# Patient Record
Sex: Male | Born: 1980 | Race: Asian | Hispanic: No | Marital: Single | Smoking: Former smoker
Health system: Southern US, Community
[De-identification: ages and names within clinical notes are randomized; demographics above are authoritative.]

## PROBLEM LIST (undated history)

## (undated) DIAGNOSIS — G510 Bell's palsy: Secondary | ICD-10-CM

## (undated) DIAGNOSIS — R519 Headache, unspecified: Secondary | ICD-10-CM

## (undated) DIAGNOSIS — E7800 Pure hypercholesterolemia, unspecified: Secondary | ICD-10-CM

## (undated) DIAGNOSIS — F32A Depression, unspecified: Secondary | ICD-10-CM

## (undated) DIAGNOSIS — M1990 Unspecified osteoarthritis, unspecified site: Secondary | ICD-10-CM

---

## 2019-01-08 DIAGNOSIS — F321 Major depressive disorder, single episode, moderate: Secondary | ICD-10-CM | POA: Diagnosis not present

## 2019-01-08 DIAGNOSIS — F419 Anxiety disorder, unspecified: Secondary | ICD-10-CM | POA: Diagnosis not present

## 2019-02-12 DIAGNOSIS — F419 Anxiety disorder, unspecified: Secondary | ICD-10-CM | POA: Diagnosis not present

## 2019-02-12 DIAGNOSIS — F321 Major depressive disorder, single episode, moderate: Secondary | ICD-10-CM | POA: Diagnosis not present

## 2019-04-04 DIAGNOSIS — F321 Major depressive disorder, single episode, moderate: Secondary | ICD-10-CM | POA: Diagnosis not present

## 2019-04-04 DIAGNOSIS — F419 Anxiety disorder, unspecified: Secondary | ICD-10-CM | POA: Diagnosis not present

## 2019-05-14 DIAGNOSIS — F419 Anxiety disorder, unspecified: Secondary | ICD-10-CM | POA: Diagnosis not present

## 2019-05-14 DIAGNOSIS — F321 Major depressive disorder, single episode, moderate: Secondary | ICD-10-CM | POA: Diagnosis not present

## 2019-06-18 DIAGNOSIS — Z20828 Contact with and (suspected) exposure to other viral communicable diseases: Secondary | ICD-10-CM | POA: Diagnosis not present

## 2019-09-04 DIAGNOSIS — F321 Major depressive disorder, single episode, moderate: Secondary | ICD-10-CM | POA: Diagnosis not present

## 2019-09-04 DIAGNOSIS — F419 Anxiety disorder, unspecified: Secondary | ICD-10-CM | POA: Diagnosis not present

## 2019-11-26 DIAGNOSIS — F321 Major depressive disorder, single episode, moderate: Secondary | ICD-10-CM | POA: Diagnosis not present

## 2019-11-26 DIAGNOSIS — F419 Anxiety disorder, unspecified: Secondary | ICD-10-CM | POA: Diagnosis not present

## 2020-05-09 DIAGNOSIS — H9193 Unspecified hearing loss, bilateral: Secondary | ICD-10-CM | POA: Diagnosis not present

## 2020-07-15 ENCOUNTER — Other Ambulatory Visit: Payer: Self-pay

## 2020-07-15 ENCOUNTER — Observation Stay (HOSPITAL_COMMUNITY): Payer: 59

## 2020-07-15 ENCOUNTER — Encounter (HOSPITAL_COMMUNITY): Payer: Self-pay | Admitting: *Deleted

## 2020-07-15 ENCOUNTER — Emergency Department (HOSPITAL_COMMUNITY): Payer: 59

## 2020-07-15 ENCOUNTER — Inpatient Hospital Stay (HOSPITAL_COMMUNITY)
Admission: EM | Admit: 2020-07-15 | Discharge: 2020-07-17 | DRG: 065 | Disposition: A | Discharge status: Home / Self Care | Payer: 59 | Attending: Family Medicine | Admitting: Family Medicine

## 2020-07-15 DIAGNOSIS — Z20822 Contact with and (suspected) exposure to covid-19: Secondary | ICD-10-CM | POA: Diagnosis present

## 2020-07-15 DIAGNOSIS — R4781 Slurred speech: Secondary | ICD-10-CM | POA: Diagnosis present

## 2020-07-15 DIAGNOSIS — E781 Pure hyperglyceridemia: Secondary | ICD-10-CM | POA: Diagnosis present

## 2020-07-15 DIAGNOSIS — R29701 NIHSS score 1: Secondary | ICD-10-CM | POA: Diagnosis present

## 2020-07-15 DIAGNOSIS — R200 Anesthesia of skin: Secondary | ICD-10-CM | POA: Diagnosis not present

## 2020-07-15 DIAGNOSIS — G8191 Hemiplegia, unspecified affecting right dominant side: Secondary | ICD-10-CM | POA: Diagnosis present

## 2020-07-15 DIAGNOSIS — E785 Hyperlipidemia, unspecified: Secondary | ICD-10-CM | POA: Diagnosis present

## 2020-07-15 DIAGNOSIS — I639 Cerebral infarction, unspecified: Secondary | ICD-10-CM | POA: Diagnosis not present

## 2020-07-15 DIAGNOSIS — I6522 Occlusion and stenosis of left carotid artery: Secondary | ICD-10-CM | POA: Diagnosis not present

## 2020-07-15 DIAGNOSIS — F17200 Nicotine dependence, unspecified, uncomplicated: Secondary | ICD-10-CM | POA: Diagnosis present

## 2020-07-15 DIAGNOSIS — I779 Disorder of arteries and arterioles, unspecified: Secondary | ICD-10-CM

## 2020-07-15 DIAGNOSIS — R7401 Elevation of levels of liver transaminase levels: Secondary | ICD-10-CM

## 2020-07-15 DIAGNOSIS — I63132 Cerebral infarction due to embolism of left carotid artery: Principal | ICD-10-CM | POA: Diagnosis present

## 2020-07-15 DIAGNOSIS — R748 Abnormal levels of other serum enzymes: Secondary | ICD-10-CM | POA: Diagnosis present

## 2020-07-15 DIAGNOSIS — D696 Thrombocytopenia, unspecified: Secondary | ICD-10-CM | POA: Diagnosis present

## 2020-07-15 HISTORY — DX: Bell's palsy: G51.0

## 2020-07-15 HISTORY — DX: Cerebral infarction, unspecified: I63.9

## 2020-07-15 HISTORY — DX: Pure hypercholesterolemia, unspecified: E78.00

## 2020-07-15 LAB — DIFFERENTIAL
Abs Immature Granulocytes: 0.03 10*3/uL (ref 0.00–0.07)
Basophils Absolute: 0.1 10*3/uL (ref 0.0–0.1)
Basophils Relative: 1 %
Eosinophils Absolute: 0.2 10*3/uL (ref 0.0–0.5)
Eosinophils Relative: 3 %
Immature Granulocytes: 0 %
Lymphocytes Relative: 28 %
Lymphs Abs: 2.1 10*3/uL (ref 0.7–4.0)
Monocytes Absolute: 0.4 10*3/uL (ref 0.1–1.0)
Monocytes Relative: 6 %
Neutro Abs: 4.8 10*3/uL (ref 1.7–7.7)
Neutrophils Relative %: 62 %

## 2020-07-15 LAB — RESP PANEL BY RT-PCR (FLU A&B, COVID) ARPGX2
Influenza A by PCR: NEGATIVE
Influenza B by PCR: NEGATIVE
SARS Coronavirus 2 by RT PCR: NEGATIVE

## 2020-07-15 LAB — CBC
HCT: 49.5 % (ref 39.0–52.0)
Hemoglobin: 16.2 g/dL (ref 13.0–17.0)
MCH: 28.1 pg (ref 26.0–34.0)
MCHC: 32.7 g/dL (ref 30.0–36.0)
MCV: 85.9 fL (ref 80.0–100.0)
Platelets: 103 10*3/uL — ABNORMAL LOW (ref 150–400)
RBC: 5.76 MIL/uL (ref 4.22–5.81)
RDW: 13.2 % (ref 11.5–15.5)
WBC: 7.6 10*3/uL (ref 4.0–10.5)
nRBC: 0 % (ref 0.0–0.2)

## 2020-07-15 LAB — COMPREHENSIVE METABOLIC PANEL
ALT: 70 U/L — ABNORMAL HIGH (ref 0–44)
AST: 30 U/L (ref 15–41)
Albumin: 4.4 g/dL (ref 3.5–5.0)
Alkaline Phosphatase: 91 U/L (ref 38–126)
Anion gap: 9 (ref 5–15)
BUN: 10 mg/dL (ref 6–20)
CO2: 26 mmol/L (ref 22–32)
Calcium: 9.7 mg/dL (ref 8.9–10.3)
Chloride: 103 mmol/L (ref 98–111)
Creatinine, Ser: 0.83 mg/dL (ref 0.61–1.24)
GFR, Estimated: >60 mL/min (ref >60)
Glucose, Bld: 96 mg/dL (ref 70–99)
Potassium: 3.9 mmol/L (ref 3.5–5.1)
Sodium: 138 mmol/L (ref 135–145)
Total Bilirubin: 0.6 mg/dL (ref 0.3–1.2)
Total Protein: 7.4 g/dL (ref 6.5–8.1)

## 2020-07-15 LAB — I-STAT CHEM 8, ED
BUN: 11 mg/dL (ref 6–20)
Calcium, Ion: 1.26 mmol/L (ref 1.15–1.40)
Chloride: 103 mmol/L (ref 98–111)
Creatinine, Ser: 0.9 mg/dL (ref 0.61–1.24)
Glucose, Bld: 91 mg/dL (ref 70–99)
HCT: 49 % (ref 39.0–52.0)
Hemoglobin: 16.7 g/dL (ref 13.0–17.0)
Potassium: 4.1 mmol/L (ref 3.5–5.1)
Sodium: 139 mmol/L (ref 135–145)
TCO2: 27 mmol/L (ref 22–32)

## 2020-07-15 LAB — PROTIME-INR
INR: 1 (ref 0.8–1.2)
Prothrombin Time: 12.3 seconds (ref 11.4–15.2)

## 2020-07-15 LAB — APTT: aPTT: 29 seconds (ref 24–36)

## 2020-07-15 LAB — CBG MONITORING, ED: Glucose-Capillary: 89 mg/dL (ref 70–99)

## 2020-07-15 IMAGING — CR DG CHEST 2V
2 series · 2 of 2 positions shown · non-contrast
Comparison: CTA head and neck today.

CLINICAL DATA: 39-year-old male with acute onset right extremity
numbness. Code stroke.

EXAM:
CHEST - 2 VIEW

[chest lat]
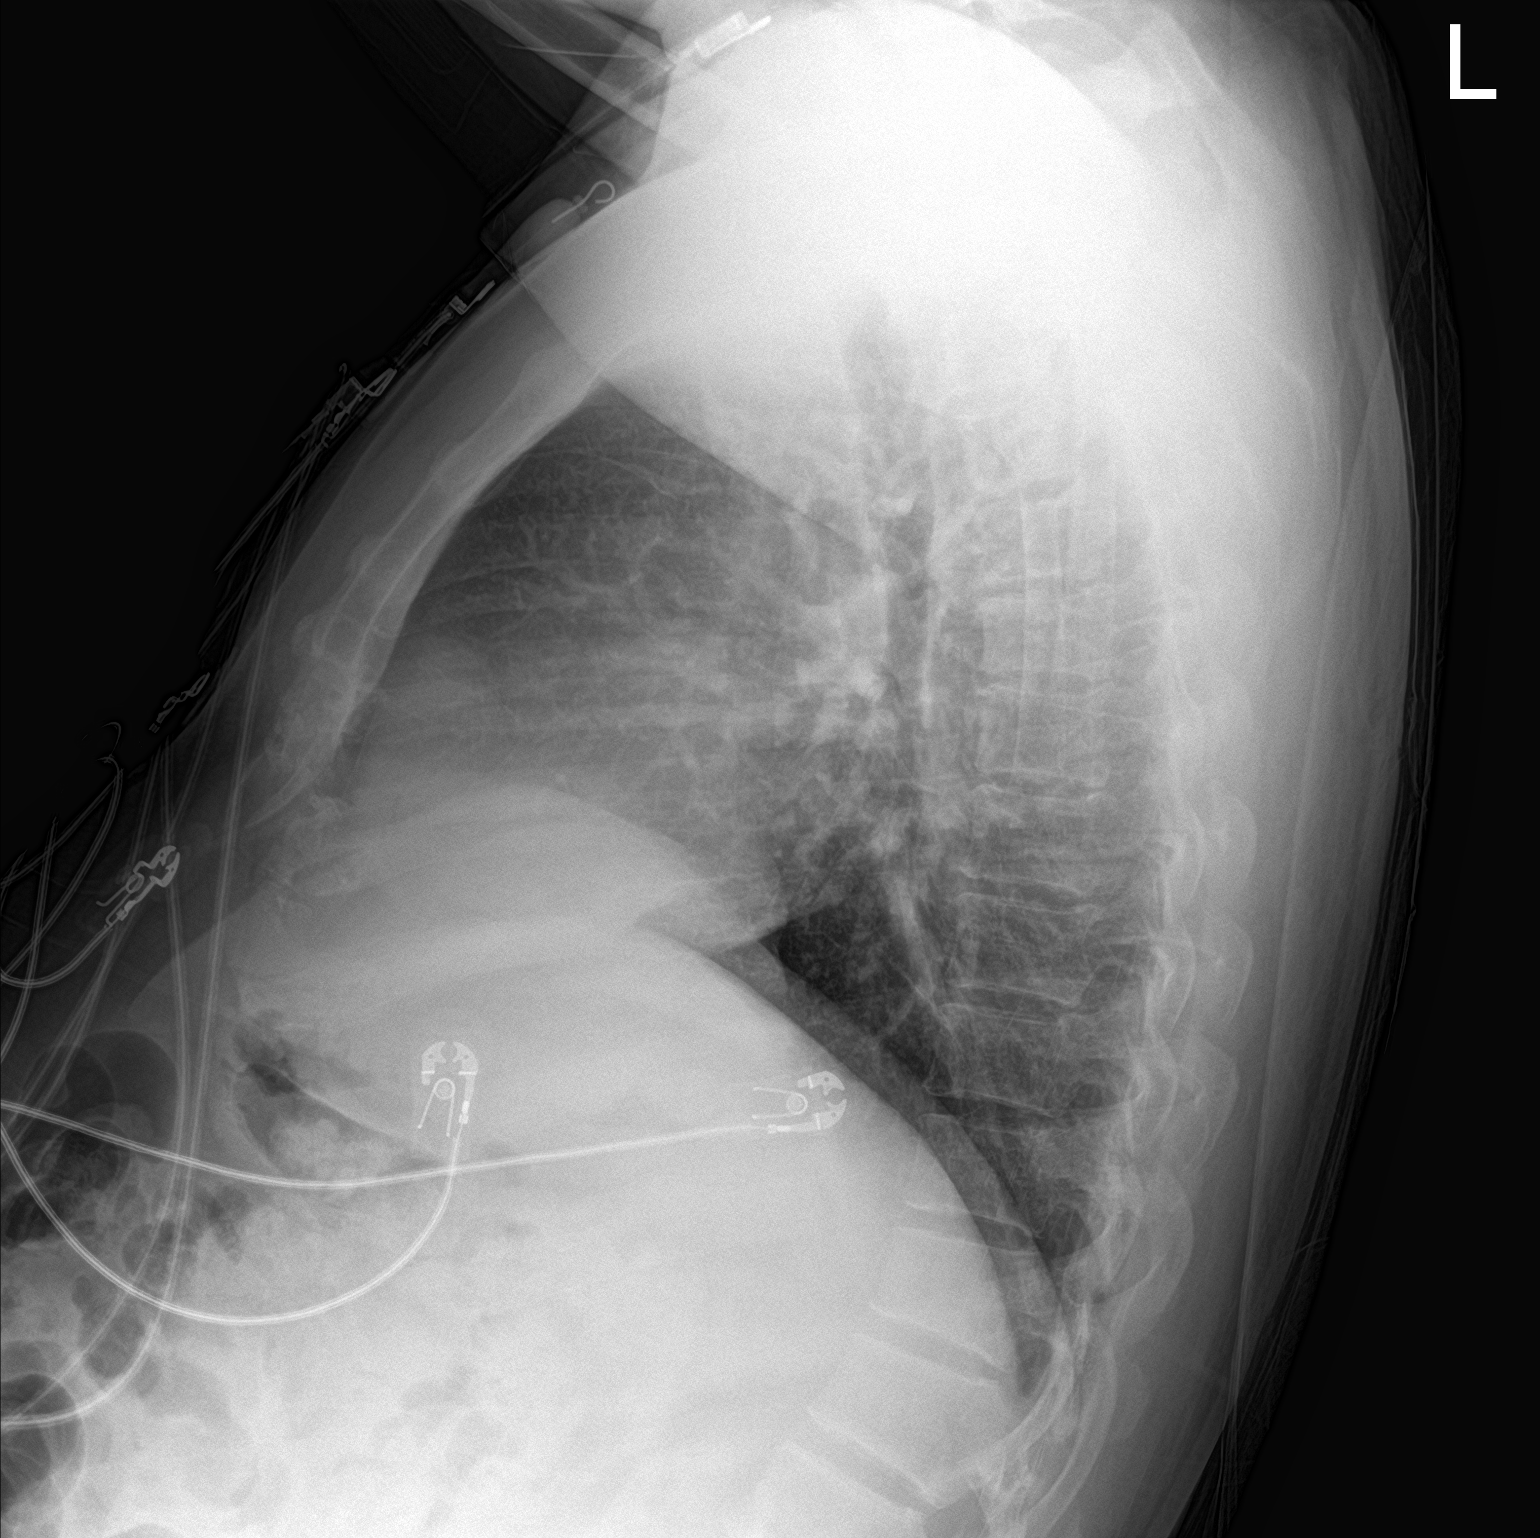

[chest ap]
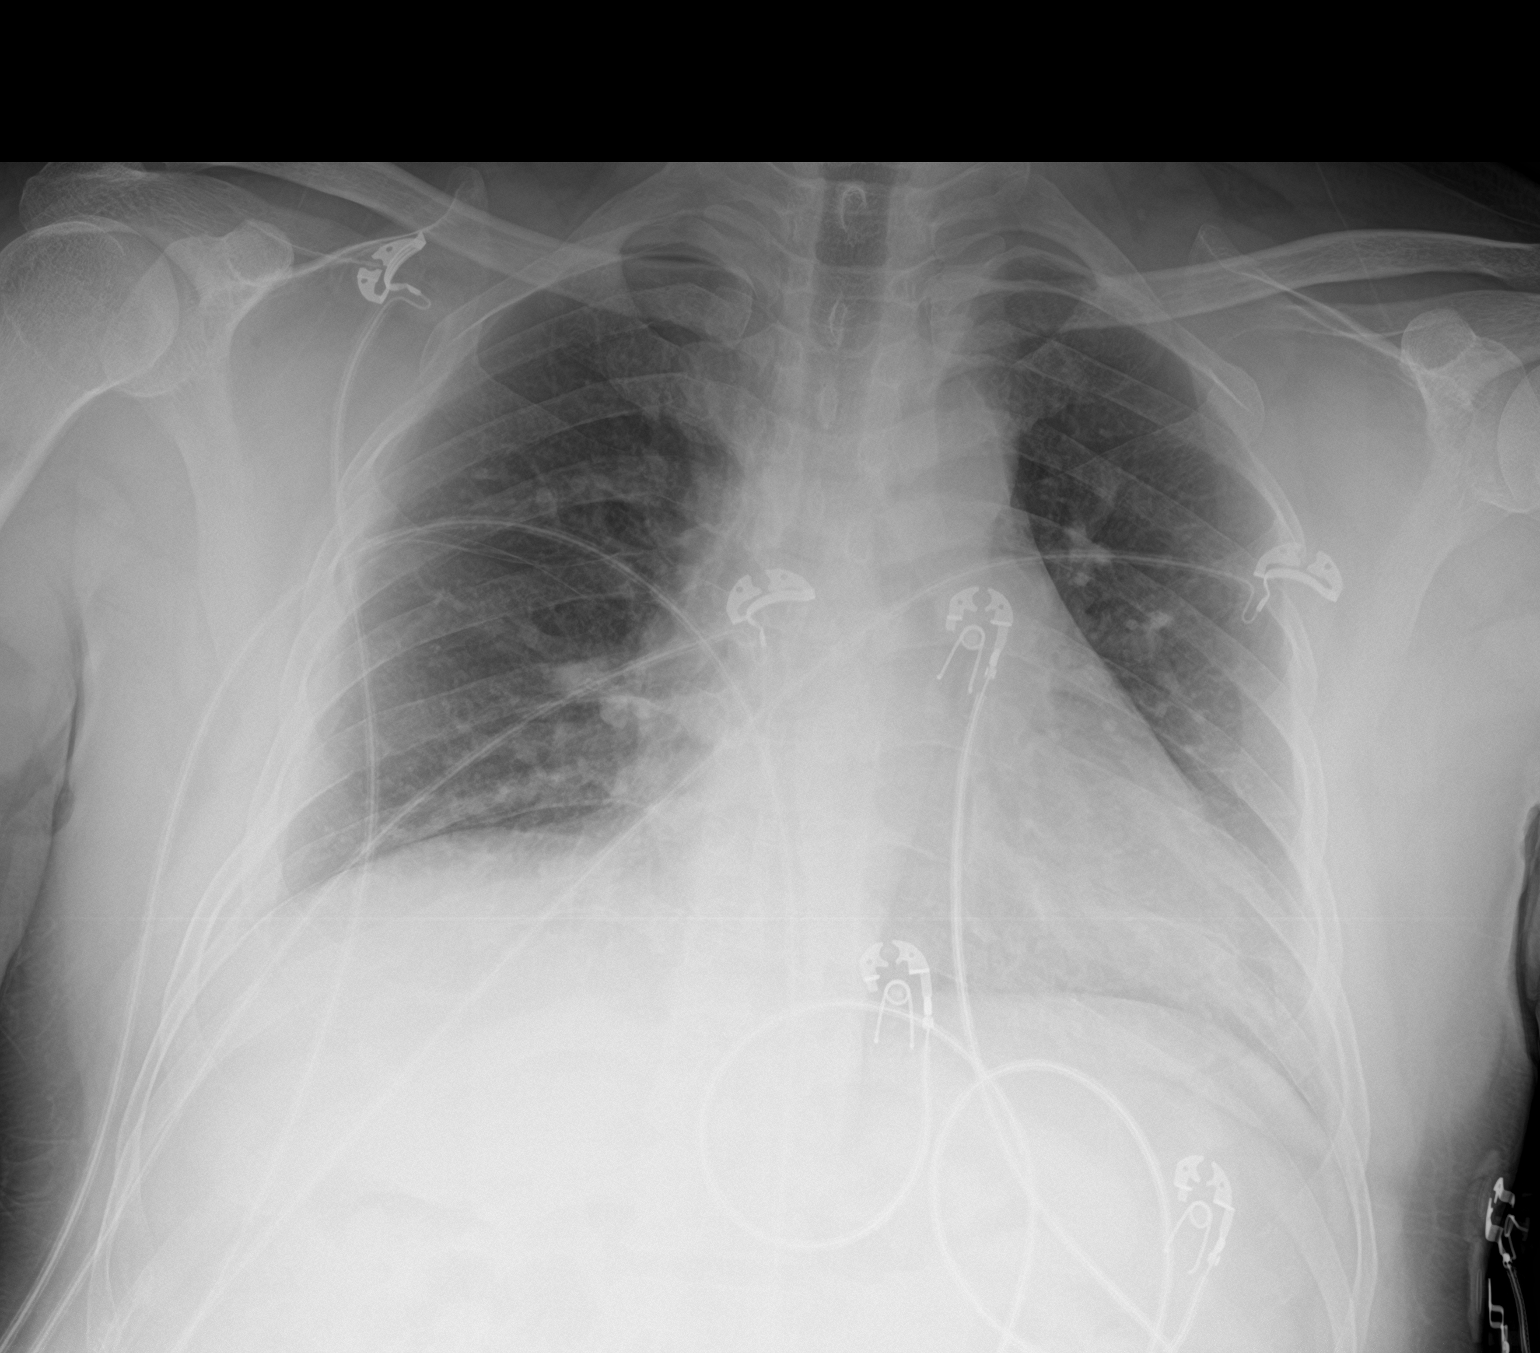

[2 of 2 positions shown; findings below may reference images not displayed]

FINDINGS: Upright AP and lateral views of the chest. Low normal lung volumes.
Normal cardiac size and mediastinal contours. Visualized tracheal
air column is within normal limits. No pneumothorax, pulmonary
edema, pleural effusion or confluent pulmonary opacity.

No osseous abnormality identified. Negative visible bowel gas
pattern.
IMPRESSION: No acute cardiopulmonary abnormality.

## 2020-07-15 IMAGING — CT CT ANGIO HEAD-NECK
2 of 7 series · 8 of 33 positions shown · IV contrast (OMNI 350)
Comparison: Same day CT head.

CLINICAL DATA: Left face numbness and right arm numbness.



[Series 5: cta neck · axial · 0.44mm/px · z∈[-180,-66]mm · 2 of 173 slices shown]
[im 58/173  soft-tissue]
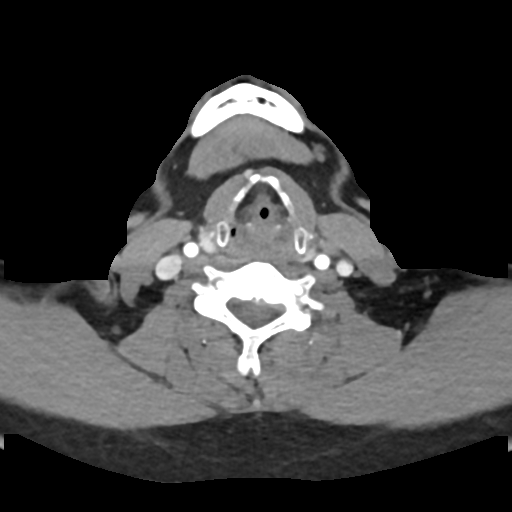
[im 115/173  soft-tissue]
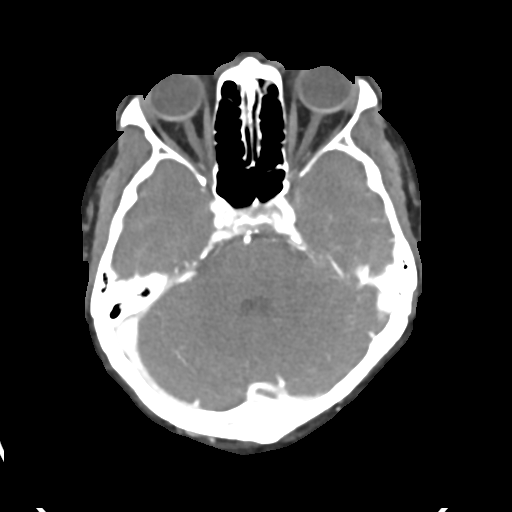

[Series 7: cta neck axial · axial · 0.39mm/px · z∈[-242,+3]mm · 6 of 345 slices shown]
[im 50/345  soft-tissue]
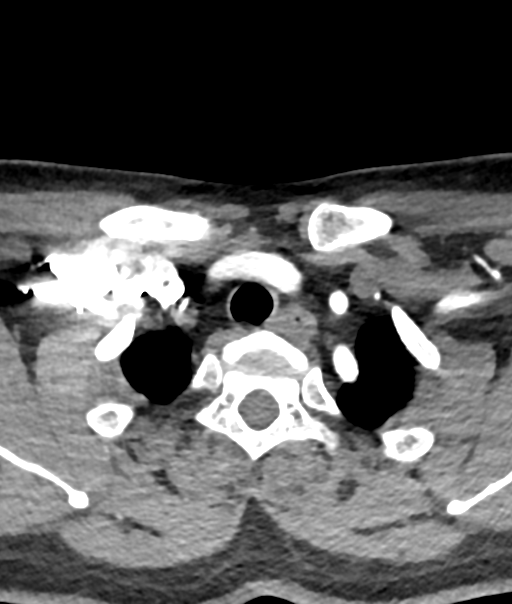
[im 99/345  bone]
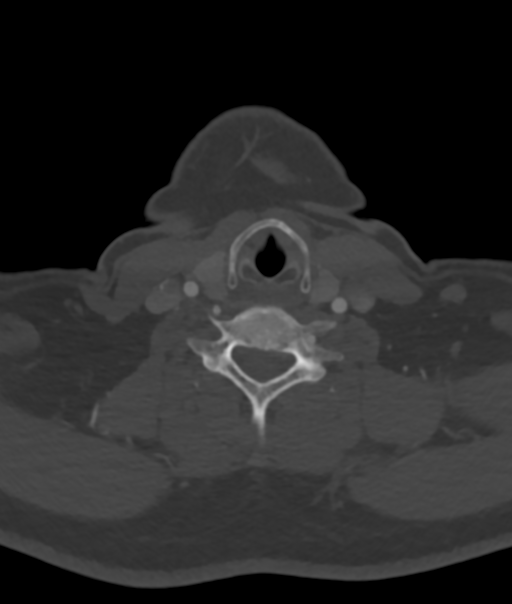
[im 148/345  soft-tissue]
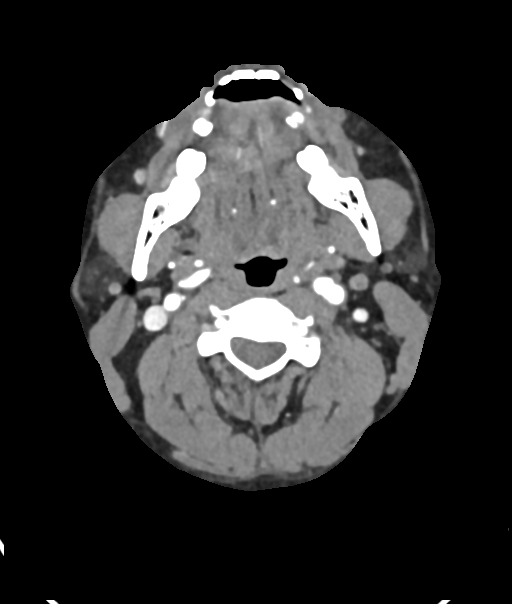
[im 197/345  bone]
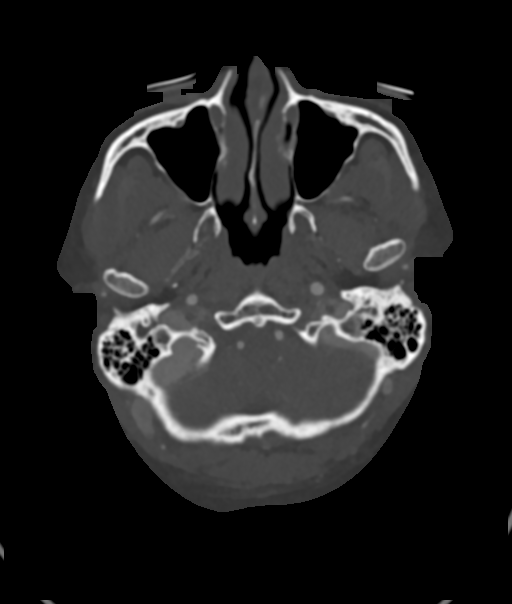
[im 246/345  soft-tissue]
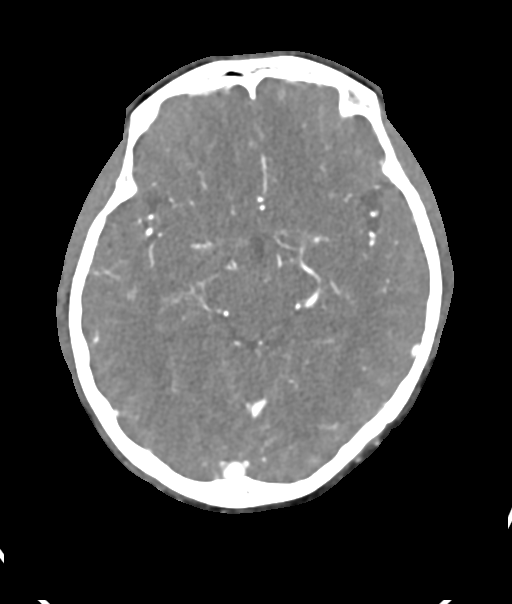
[im 295/345  bone]
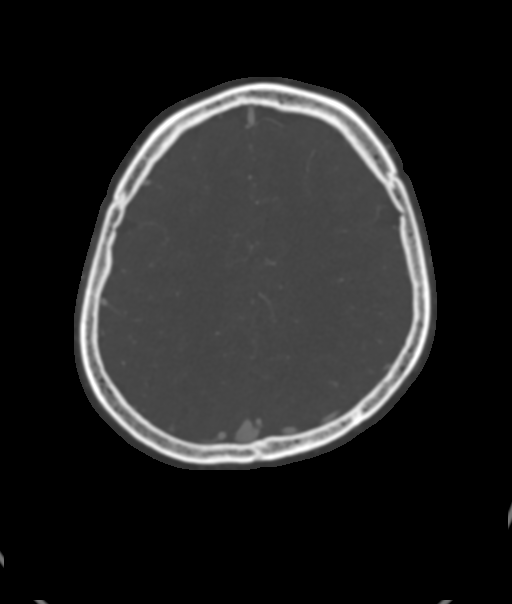

[8 of 33 positions shown; findings below may reference images not displayed]

FINDINGS: CTA NECK FINDINGS

Aortic arch: Imaged portion shows no evidence of aneurysm or
dissection. No significant stenosis of the major arch vessel
origins.

Right carotid system: No evidence of dissection, stenosis (50% or
greater) or occlusion.

Left carotid system: No evidence of dissection, stenosis (50% or
greater) or occlusion. There is a shelf-like filling defect within
the posterior aspect of the left internal carotid artery origin,
compatible with a carotid web (see series 9, image 133).

Vertebral arteries: Mildly left dominant. No evidence of dissection,
stenosis (50% or greater) or occlusion.

Skeleton: No acute fracture.

Other neck: No mass or suspicious adenopathy.

Upper chest: No acute abnormality.

Review of the MIP images confirms the above findings

CTA HEAD FINDINGS

Anterior circulation: No significant stenosis, proximal occlusion,
aneurysm, or vascular malformation.

Posterior circulation: No significant stenosis, proximal occlusion,
aneurysm, or vascular malformation.

Venous sinuses: As permitted by contrast timing, patent.

Review of the MIP images confirms the above findings
IMPRESSION: 1. No large vessel occlusion or hemodynamically significant proximal
stenosis in the head or neck.
2. Carotid web of the left internal carotid artery origin. This
finding has been reported as having an increased risk of ischemic
stroke.

Findings discussed with Dr. ZENTO at [DATE] p.m. via telephone.

## 2020-07-15 IMAGING — CT CT HEAD CODE STROKE
2 of 3 series · 14 of 47 positions shown, 17 images · non-contrast
Comparison: None.
COMPARISON: None.

Addendum:
CLINICAL DATA: Code stroke. Neuro deficit, acute stroke suspected.
Sudden onset of left-sided numbness.

EXAM:
CT HEAD WITHOUT CONTRAST
TECHNIQUE: Contiguous axial images were obtained from the base of the skull
through the vertex without intravenous contrast.

[Series 2: head 5.0 st · axial · 0.43mm/px · z∈[-37,+88]mm · 11 of 30 slices shown, 14 images]
[im 3/30  brain]
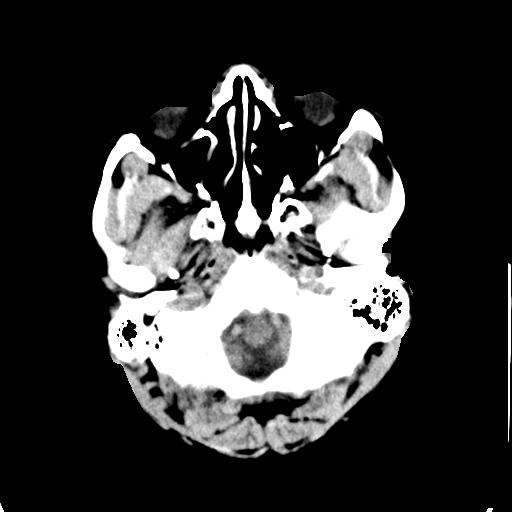
[im 3/30  bone]
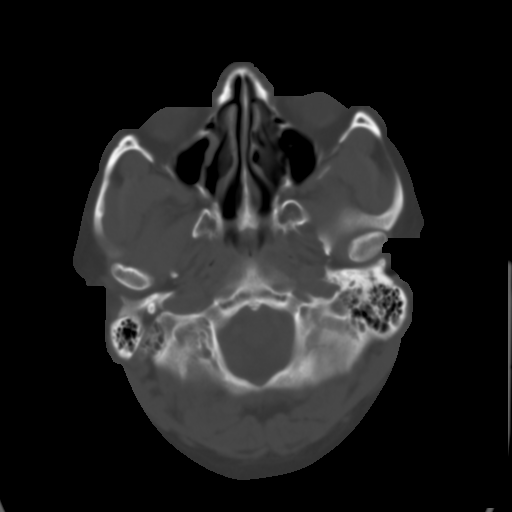
[im 5/30  brain]
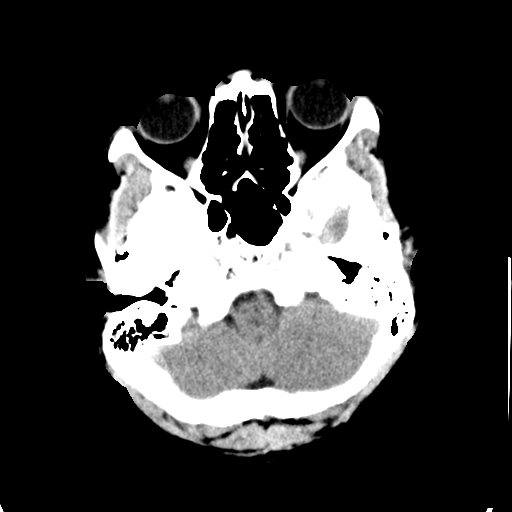
[im 8/30  brain]
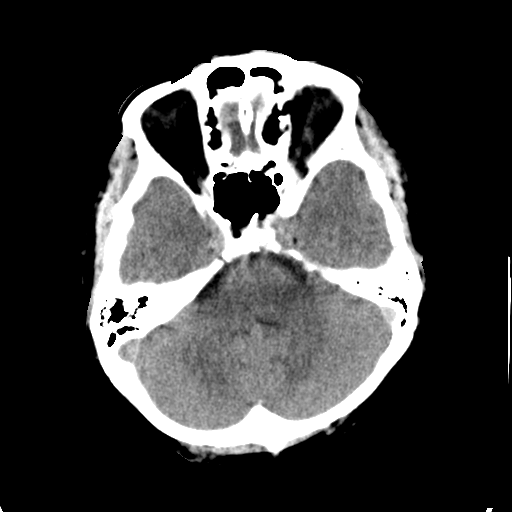
[im 10/30  brain]
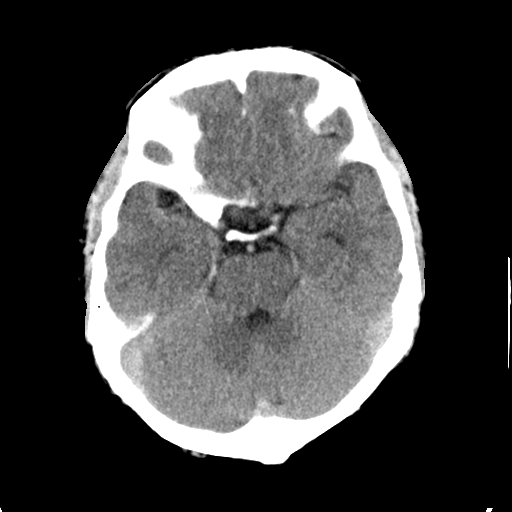
[im 13/30  brain]
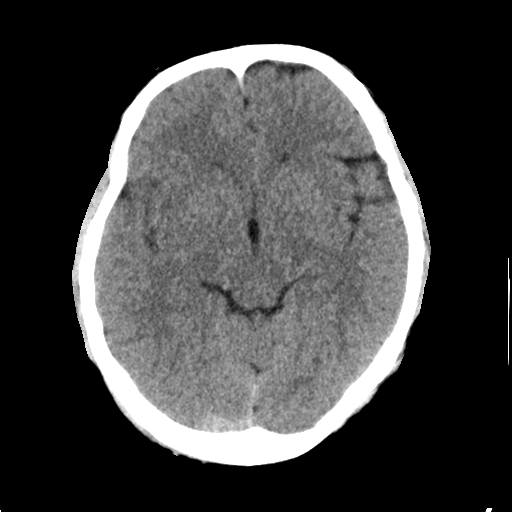
[im 13/30  bone]
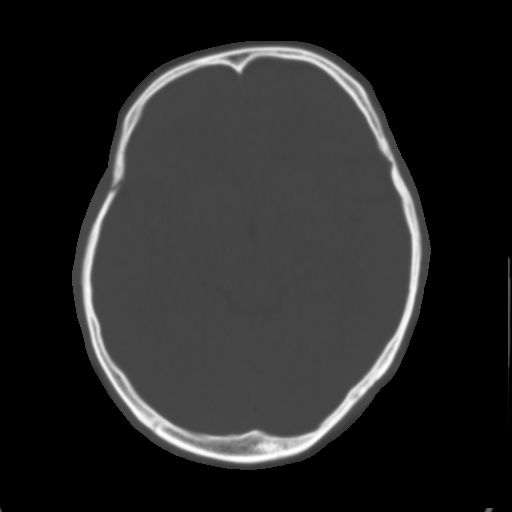
[im 16/30  brain]
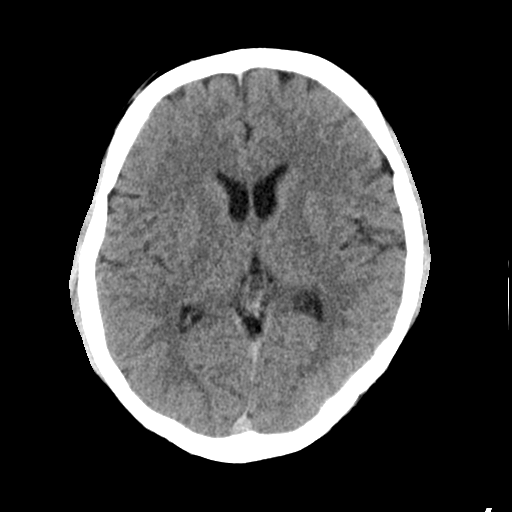
[im 18/30  brain]
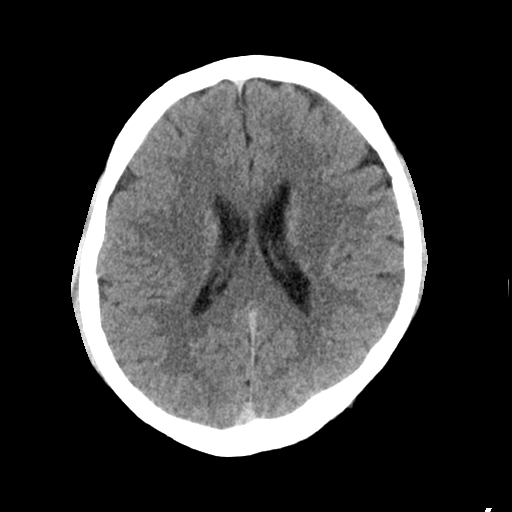
[im 21/30  brain]
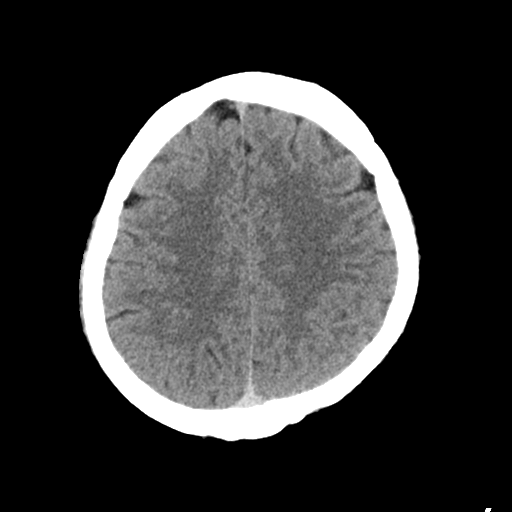
[im 23/30  brain]
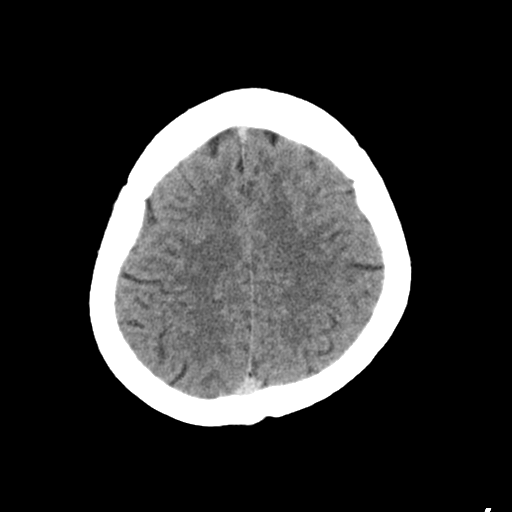
[im 23/30  bone]
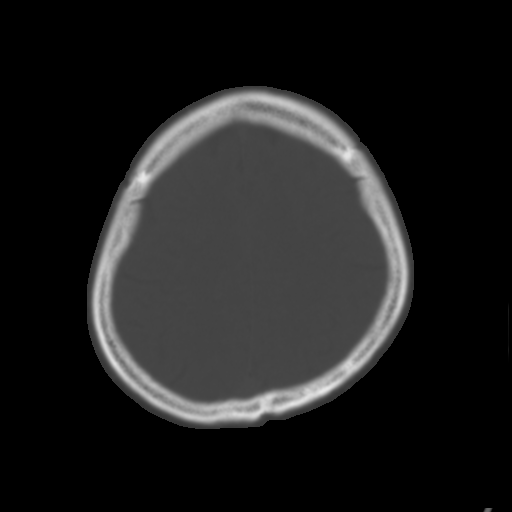
[im 26/30  brain]
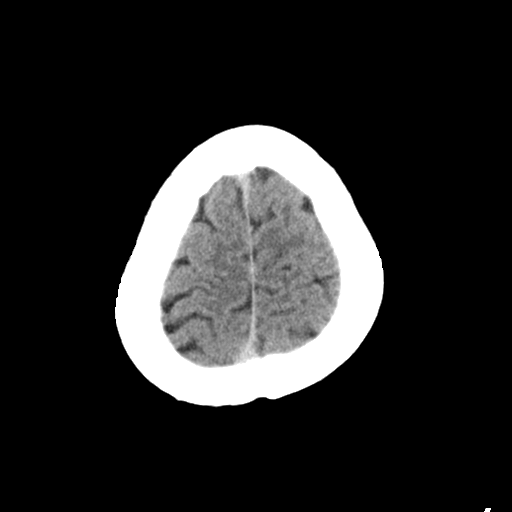
[im 28/30  brain]
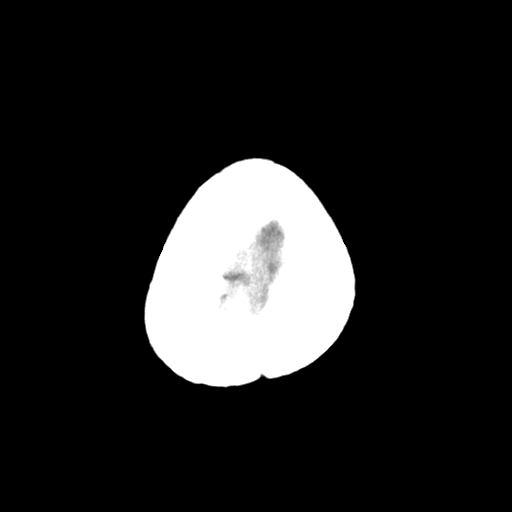

[Series 4: head 3.0 cor st · coronal · 0.29mm/px · 3 of 73 slices shown]
[im 25/73  brain]
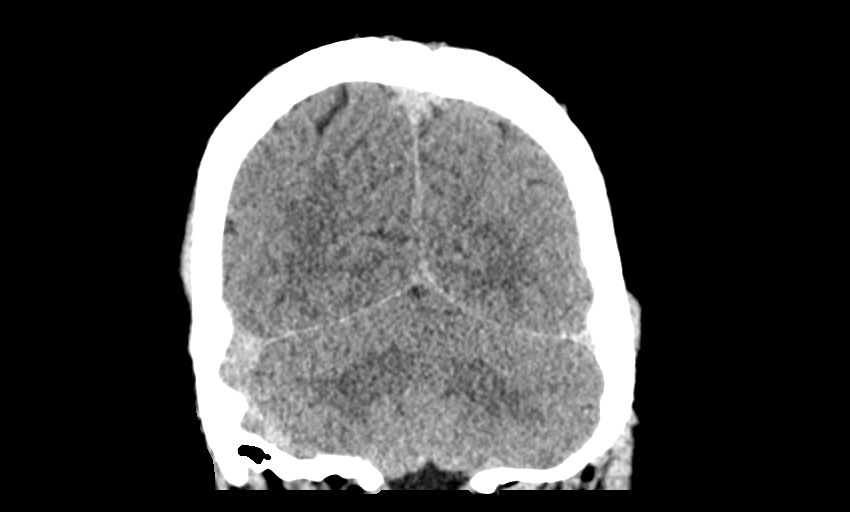
[im 33/73  brain]
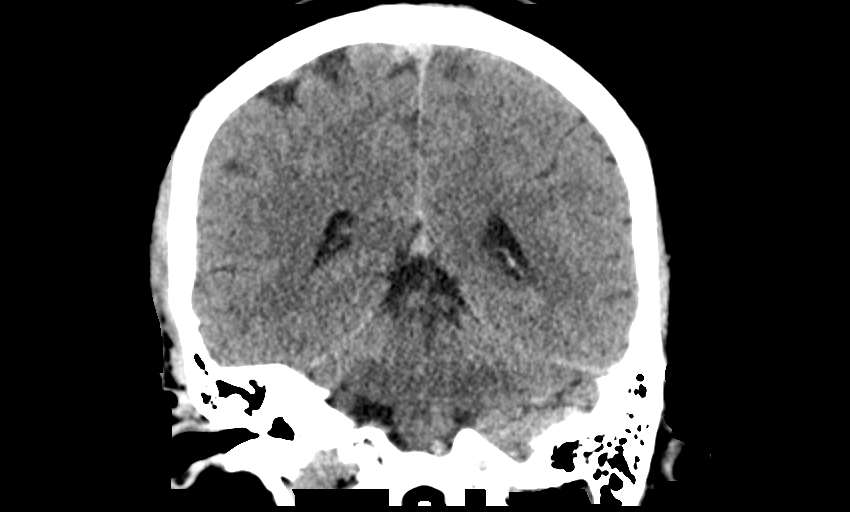
[im 41/73  brain]
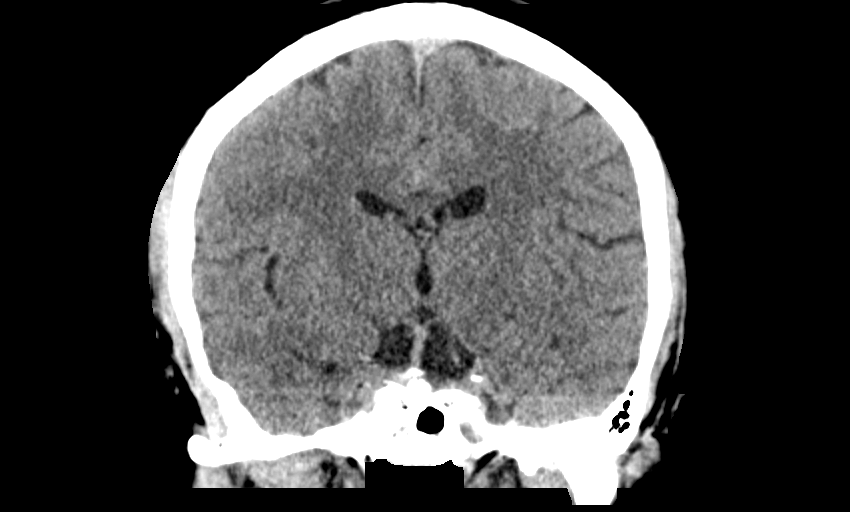

[14 of 47 positions shown; findings below may reference images not displayed]

FINDINGS: Brain: No evidence of acute large vascular territory infarction,
hemorrhage, hydrocephalus, extra-axial collection or mass
lesion/mass effect.

Vascular: No hyperdense vessel or unexpected calcification.

Skull: No acute fracture.

Sinuses/Orbits: Visualized sinuses are clear. No acute orbital
abnormality.

Other: No mastoid effusions.

ASPECTS ([place] Stroke Program Early CT Score) total score (0-10
with 10 being normal): 10
IMPRESSION: No evidence of acute intracranial abnormality. ASPECTS is 10.

Dr. EVEROY was paged at the time of dictation.

ADDENDUM:
Findings discussed with Dr. EVEROY at [DATE] via telephone.

*** End of Addendum ***
FINDINGS: Brain: No evidence of acute large vascular territory infarction,
hemorrhage, hydrocephalus, extra-axial collection or mass
lesion/mass effect.

Vascular: No hyperdense vessel or unexpected calcification.

Skull: No acute fracture.

Sinuses/Orbits: Visualized sinuses are clear. No acute orbital
abnormality.

Other: No mastoid effusions.

ASPECTS ([place] Stroke Program Early CT Score) total score (0-10
with 10 being normal): 10
IMPRESSION: No evidence of acute intracranial abnormality. ASPECTS is 10.

Dr. EVEROY was paged at the time of dictation.

## 2020-07-15 MED ORDER — ASPIRIN 325 MG PO TABS: 325.0000 mg | ORAL_TABLET | Freq: Every day | ORAL | Status: DC

## 2020-07-15 MED ORDER — ACETAMINOPHEN 160 MG/5ML PO SOLN: 650.0000 mg | ORAL | Status: DC | PRN

## 2020-07-15 MED ORDER — SODIUM CHLORIDE 0.9% FLUSH
3.0000 mL | Freq: Once | INTRAVENOUS | Status: AC
  Administered 2020-07-15: 3 mL via INTRAVENOUS

## 2020-07-15 MED ORDER — STROKE: EARLY STAGES OF RECOVERY BOOK
Freq: Once | Status: DC
  Filled 2020-07-15: qty 1

## 2020-07-15 MED ORDER — ASPIRIN 300 MG RE SUPP: 300.0000 mg | Freq: Every day | RECTAL | Status: DC

## 2020-07-15 MED ORDER — ACETAMINOPHEN 650 MG RE SUPP: 650.0000 mg | RECTAL | Status: DC | PRN

## 2020-07-15 MED ORDER — ACETAMINOPHEN 325 MG PO TABS: 650.0000 mg | ORAL_TABLET | ORAL | Status: DC | PRN

## 2020-07-15 MED ORDER — ENOXAPARIN SODIUM 40 MG/0.4ML ~~LOC~~ SOLN
40.0000 mg | Freq: Every day | SUBCUTANEOUS | Status: DC
  Administered 2020-07-16 – 2020-07-17 (×2): 40 mg via SUBCUTANEOUS
  Filled 2020-07-15 (×2): qty 0.4

## 2020-07-15 MED ORDER — IOHEXOL 350 MG/ML SOLN
80.0000 mL | Freq: Once | INTRAVENOUS | Status: AC | PRN
  Administered 2020-07-15: 80 mL via INTRAVENOUS

## 2020-07-15 MED ORDER — ASPIRIN 325 MG PO TABS
325.0000 mg | ORAL_TABLET | Freq: Once | ORAL | Status: AC
  Administered 2020-07-15: 325 mg via ORAL
  Filled 2020-07-15: qty 1

## 2020-07-15 NOTE — Consult Note (Addendum)
Neurology Consult H&P  CC: right arm weakness/numbness  History is obtained from: patient and EMS  HPI: Frank Cummings is a 39 y.o. male right handed was helping his cousin on a jeep tonight at 6 and became dizzy and noticed numbness in his right arm and leg.   Hx of bells palsy in 2017.   Denies visual/ hearing changes, fever chills  LKW: above tpa given?: Too mild to treat IR Thrombectomy? No lvo Modified Rankin Scale: 0-Completely asymptomatic and back to baseline post- stroke NIHSS: 1  ROS: A complete ROS was performed and is negative except as noted in the HPI.    Past Medical History:  Diagnosis Date  . Bell's palsy   . High cholesterol      No family history on file.  Social History:  reports that he has been smoking. He does not have any smokeless tobacco history on file. He reports current alcohol use. He reports that he does not use drugs.   Prior to Admission medications   Not on File    Exam: Current vital signs: BP 131/73   Pulse 66   Temp 98.3 F (36.8 C)   Resp 19   Wt 75.1 kg   SpO2 95%   Physical Exam  Constitutional: Appears well-developed and well-nourished.  Psych: Affect appropriate to situation Eyes: No scleral injection HENT: No OP obstrucion Head: Normocephalic.  Cardiovascular: Normal rate and regular rhythm.  Respiratory: Effort normal and breath sounds normal to anterior ascultation GI: Soft.  No distension. There is no tenderness.  Skin: WDI  Neuro: Mental Status: Patient is awake, alert, oriented to person, place, month, year, and situation. Patient is able to give a clear and coherent history. No signs of aphasia or neglect. Cranial Nerves: II: Visual Fields are full. Pupils are equal, round, and reactive to light. III,IV, VI: EOMI without ptosis or diploplia.  V: Facial sensation is symmetric to temperature VII: Facial movement is symmetric.  VIII: hearing is intact to voice X: Uvula elevates symmetrically XI:  Shoulder shrug is symmetric. XII: tongue is midline without atrophy or fasciculations.  Motor: Tone is normal. Bulk is normal. 5/5 strength was present in all four extremities. Sensory: Sensation decreased to light touch and temperature in right arm > leg. Deep Tendon Reflexes: 2+ and symmetric in the biceps and patellae. Plantars: Toes are downgoing bilaterally. Cerebellar: FNF and HKS are intact bilaterally.  I have reviewed labs in epic and the pertinent results are: Glu 91, Na 139  I have reviewed the images obtained: NCT head showed no acute ischemic changes. CTA head and neck showed no LVO however, showed left ICA web #8 - 109/201 and #11 - 25/48.  Assessment: Frank Cummings is a 39 y.o. 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 He may need vascular consult to determine medical vs surgical management in young patients  Aspirin 324mg  now.  Plan: - MRI brain without contrast. - Recommend TTE. - Recommend labs: HbA1c, lipid panel, TSH. - Recommend Statin if LDL > 70 - Continue aspirin 81mg  daily for now. - Permissive hypertension first 24 h < 220/110. - Telemetry monitoring for arrhythmia. - Recommend bedside Swallow screen. - Recommend Stroke education. - Recommend PT/OT/SLP consult.  This patient is critically ill and at significant risk of neurological worsening, death and care requires constant monitoring of vital signs, hemodynamics,respiratory and cardiac monitoring, neurological assessment, discussion with family, other specialists and medical  decision making of high complexity. I spent 75 minutes of neurocritical care time  in the care of  this patient. This was time spent independent of any time provided by nurse practitioner or PA.  Electronically signed by: Dr. Marisue Humble Pager: [phone] 07/15/2020, 9:55 PM

## 2020-07-15 NOTE — H&P (Addendum)
History and Physical    Frank Cummings GTX:[id number] DOB: 1980-12-10 DOA: 07/15/2020  PCP: Patient, No Pcp Per  Patient coming from: Home  I have personally briefly reviewed patient's old medical records in [facility]  Chief Complaint: R sided numbness  HPI: Frank Cummings is a 39 y.o. male with medical history significant of tobacco abuse, HLD, Bell's Palsy in 2017.  Pt presents to ED with c/o R sided body numbness.  Onset at about 6:15 PM while driving in car.  Some blurry vision at that time has resolved.  Numbness persists.  No headache, vomiting, trauma, prior stroke, fever, SOB.   ED Course: CTA head/neck shows L: ICA Web.  Hospitalist to admit for concern for cryptogenic stroke.  Sounds like may need vascular surgery consult in AM.   Review of Systems: As per HPI, otherwise all review of systems negative.  Past Medical History:  Diagnosis Date  . Bell's palsy   . High cholesterol     History reviewed. No pertinent surgical history.   reports that he has been smoking. He does not have any smokeless tobacco history on file. He reports current alcohol use. He reports that he does not use drugs.  No Known Allergies  No family history on file. No reported family history of early onset stroke.  Prior to Admission medications   Medication Sig Start Date End Date Taking? Authorizing Provider  acetaminophen (TYLENOL) 325 MG tablet Take 325 mg by mouth every 6 (six) hours as needed for mild pain.   Yes [provider]    Physical Exam: Vitals:   07/15/20 2115 07/15/20 2145 07/15/20 2230 07/15/20 2300  BP: 131/73 112/88 (!) 124/97 117/66  Pulse: 66 63 64 70  Resp: 19 18 18 18   Temp:      SpO2: 95% 96% 97% 100%  Weight:        Constitutional: NAD, calm, comfortable Eyes: PERRL, lids and conjunctivae normal ENMT: Mucous membranes are moist. Posterior pharynx clear of any exudate or lesions.Normal dentition.  Neck: normal, supple, no masses, no  thyromegaly Respiratory: clear to auscultation bilaterally, no wheezing, no crackles. Normal respiratory effort. No accessory muscle use.  Cardiovascular: Regular rate and rhythm, no murmurs / rubs / gallops. No extremity edema. 2+ pedal pulses. No carotid bruits.  Abdomen: no tenderness, no masses palpated. No hepatosplenomegaly. Bowel sounds positive.  Musculoskeletal: no clubbing / cyanosis. No joint deformity upper and lower extremities. Good ROM, no contractures. Normal muscle tone.  Skin: no rashes, lesions, ulcers. No induration Neurologic: CN 2-12 grossly intact. Sensation intact, DTR normal. Strength 5/5 in all 4.  Psychiatric: Normal judgment and insight. Alert and oriented x 3. Normal mood.    Labs on Admission: I have personally reviewed following labs and imaging studies  CBC: Recent Labs  Lab 07/15/20 2004 07/15/20 2015  WBC 7.6  --   NEUTROABS 4.8  --   HGB 16.2 16.7  HCT 49.5 49.0  MCV 85.9  --   PLT 103*  --    Basic Metabolic Panel: Recent Labs  Lab 07/15/20 2004 07/15/20 2015  NA 138 139  K 3.9 4.1  CL 103 103  CO2 26  --   GLUCOSE 96 91  BUN 10 11  CREATININE 0.83 0.90  CALCIUM 9.7  --    GFR: CrCl cannot be calculated (Unknown ideal weight.). Liver Function Tests: Recent Labs  Lab 07/15/20 2004  AST 30  ALT 70*  ALKPHOS 91  BILITOT 0.6  PROT 7.4  ALBUMIN 4.4   No results for input(s): LIPASE, AMYLASE in the last 168 hours. No results for input(s): AMMONIA in the last 168 hours. Coagulation Profile: Recent Labs  Lab 07/15/20 2004  INR 1.0   Cardiac Enzymes: No results for input(s): CKTOTAL, CKMB, CKMBINDEX, TROPONINI in the last 168 hours. BNP (last 3 results) No results for input(s): PROBNP in the last 8760 hours. HbA1C: No results for input(s): HGBA1C in the last 72 hours. CBG: Recent Labs  Lab 07/15/20 2043  GLUCAP 89   Lipid Profile: No results for input(s): CHOL, HDL, LDLCALC, TRIG, CHOLHDL, LDLDIRECT in the last 72  hours. Thyroid Function Tests: No results for input(s): TSH, T4TOTAL, FREET4, T3FREE, THYROIDAB in the last 72 hours. Anemia Panel: No results for input(s): VITAMINB12, FOLATE, FERRITIN, TIBC, IRON, RETICCTPCT in the last 72 hours. Urine analysis: No results found for: COLORURINE, APPEARANCEUR, LABSPEC, PHURINE, GLUCOSEU, HGBUR, BILIRUBINUR, KETONESUR, PROTEINUR, UROBILINOGEN, NITRITE, LEUKOCYTESUR  Radiological Exams on Admission: CT HEAD CODE STROKE WO CONTRAST  Addendum Date: 07/15/2020   ADDENDUM REPORT: 07/15/2020 20:30 ADDENDUM: Findings discussed with Dr. Thomasena Edis at 8:27 PM via telephone. Electronically Signed   By: Feliberto Harts MD   On: 07/15/2020 20:30   Result Date: 07/15/2020 CLINICAL DATA:  Code stroke. Neuro deficit, acute stroke suspected. Sudden onset of left-sided numbness. EXAM: CT HEAD WITHOUT CONTRAST TECHNIQUE: Contiguous axial images were obtained from the base of the skull through the vertex without intravenous contrast. COMPARISON:  None. FINDINGS: Brain: No evidence of acute large vascular territory infarction, hemorrhage, hydrocephalus, extra-axial collection or mass lesion/mass effect. Vascular: No hyperdense vessel or unexpected calcification. Skull: No acute fracture. Sinuses/Orbits: Visualized sinuses are clear. No acute orbital abnormality. Other: No mastoid effusions. ASPECTS [facility] Stroke Program Early CT Score) total score (0-10 with 10 being normal): 10 IMPRESSION: No evidence of acute intracranial abnormality. ASPECTS is 10. Dr. Thomasena Edis was paged at the time of dictation. Electronically Signed: By: Feliberto Harts MD On: 07/15/2020 20:19    EKG: Independently reviewed.  Assessment/Plan Principal Problem:   Acute ischemic stroke [facility]) Active Problems:   Right sided numbness    1. R sided numbness - 1. With L ICA Web on CTA, concern for cryptogenic acute ischemic stroke 2. Stroke pathway 3. MRI brain 4. 2d echo 5. Tele monitor 6. ASA 7. Neuro  consult 8. A1C, FLP 9. PT/OT/SLP 10. Probably will end up needing vascular surgery consult in AM given the ICA web finding on CTA 2. Reported HLD - 1. Checking FLP 2. Start statin if HLD confirmed.  DVT prophylaxis: Lovenox Code Status: Full Family Communication: No family in room Disposition Plan: Home after stroke work up and treatment Consults called: Neurology Admission status: Place in [place] - convert to IP when/if MRI confirms stroke     Danzig Macgregor M. DO [facility]  How to contact the [facility] Attending or Consulting provider 7A - 7P or covering provider during after hours 7P -7A, for this patient?  1. Check the care team in [facility] and look for a) attending/consulting [facility] provider listed and b) the [facility] team listed 2. Log into [email or web address]  Amion Physician Scheduling and messaging for groups and whole hospitals  On call and physician scheduling software for group practices, residents, hospitalists and other medical providers for call, clinic, rotation and shift schedules. On[employer] is a hospital-wide system for scheduling doctors and paging doctors on call. EasyPlot is for scientific plotting and data analysis.  [email or web address]  and use [place]'s universal password to  access. If you do not have the password, please contact the hospital operator.  3. Locate the [facility] provider you are looking for under [facility] and page to a number that you can be directly reached. 4. If you still have difficulty reaching the provider, please page the [facility] (Director on Call) for the Hospitalists listed on amion for assistance.  07/15/2020, 11:37 PM

## 2020-07-15 NOTE — Code Documentation (Signed)
Responded to Code Stroke called at 2006 on pt in triage. Code Stroke called for R sided weakness/sensory loss, [id number], NIH-3. CT head negative, CTA-no LVO. Plan ASA, MRI.

## 2020-07-15 NOTE — ED Notes (Signed)
Patient transported to MRI 

## 2020-07-15 NOTE — ED Notes (Signed)
Pt returned from MRI vitals are now able to be obtained.

## 2020-07-15 NOTE — ED Triage Notes (Addendum)
Pt was helping his cousin on a jeep tonight at 1815, started noticing numbness in his R arm and leg. Hx of bells palsy in 2017. No facial droop noted at this time, R arm and leg drift with decreased sensory
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
## 2020-07-16 ENCOUNTER — Observation Stay (HOSPITAL_COMMUNITY): Payer: 59

## 2020-07-16 ENCOUNTER — Inpatient Hospital Stay (HOSPITAL_COMMUNITY): Payer: 59

## 2020-07-16 DIAGNOSIS — R4781 Slurred speech: Secondary | ICD-10-CM | POA: Diagnosis present

## 2020-07-16 DIAGNOSIS — I6522 Occlusion and stenosis of left carotid artery: Secondary | ICD-10-CM | POA: Diagnosis not present

## 2020-07-16 DIAGNOSIS — E781 Pure hyperglyceridemia: Secondary | ICD-10-CM

## 2020-07-16 DIAGNOSIS — I639 Cerebral infarction, unspecified: Secondary | ICD-10-CM

## 2020-07-16 DIAGNOSIS — I6389 Other cerebral infarction: Secondary | ICD-10-CM

## 2020-07-16 DIAGNOSIS — F17200 Nicotine dependence, unspecified, uncomplicated: Secondary | ICD-10-CM | POA: Diagnosis present

## 2020-07-16 DIAGNOSIS — R748 Abnormal levels of other serum enzymes: Secondary | ICD-10-CM | POA: Diagnosis present

## 2020-07-16 DIAGNOSIS — D696 Thrombocytopenia, unspecified: Secondary | ICD-10-CM | POA: Diagnosis present

## 2020-07-16 DIAGNOSIS — I63132 Cerebral infarction due to embolism of left carotid artery: Secondary | ICD-10-CM | POA: Diagnosis present

## 2020-07-16 DIAGNOSIS — I779 Disorder of arteries and arterioles, unspecified: Secondary | ICD-10-CM | POA: Diagnosis not present

## 2020-07-16 DIAGNOSIS — G8191 Hemiplegia, unspecified affecting right dominant side: Secondary | ICD-10-CM | POA: Diagnosis present

## 2020-07-16 DIAGNOSIS — Z20822 Contact with and (suspected) exposure to covid-19: Secondary | ICD-10-CM | POA: Diagnosis present

## 2020-07-16 DIAGNOSIS — E785 Hyperlipidemia, unspecified: Secondary | ICD-10-CM | POA: Diagnosis present

## 2020-07-16 DIAGNOSIS — R29701 NIHSS score 1: Secondary | ICD-10-CM | POA: Diagnosis present

## 2020-07-16 HISTORY — DX: Pure hyperglyceridemia: E78.1

## 2020-07-16 LAB — HEMOGLOBIN A1C
Hgb A1c MFr Bld: 5.3 % (ref 4.8–5.6)
Mean Plasma Glucose: 105.41 mg/dL

## 2020-07-16 LAB — LIPID PANEL
Cholesterol: 224 mg/dL — ABNORMAL HIGH (ref 0–200)
HDL: 37 mg/dL — ABNORMAL LOW (ref >40)
LDL Cholesterol: 135 mg/dL — ABNORMAL HIGH (ref 0–99)
Total CHOL/HDL Ratio: 6.1 RATIO
Triglycerides: 262 mg/dL — ABNORMAL HIGH (ref <150)
VLDL: 52 mg/dL — ABNORMAL HIGH (ref 0–40)

## 2020-07-16 LAB — ECHOCARDIOGRAM COMPLETE
Area-P 1/2: 3.85 cm2
S' Lateral: 2.9 cm
Weight: 2649.05 oz

## 2020-07-16 LAB — RAPID URINE DRUG SCREEN, HOSP PERFORMED
Amphetamines: NOT DETECTED
Barbiturates: NOT DETECTED
Benzodiazepines: NOT DETECTED
Cocaine: NOT DETECTED
Opiates: NOT DETECTED
Tetrahydrocannabinol: NOT DETECTED

## 2020-07-16 LAB — HIV ANTIBODY (ROUTINE TESTING W REFLEX): HIV Screen 4th Generation wRfx: NONREACTIVE

## 2020-07-16 LAB — SEDIMENTATION RATE: Sed Rate: 8 mm/hr (ref 0–16)

## 2020-07-16 IMAGING — MR MR HEAD W/O CM
12 of 13 series · 44 of 48 positions shown · non-contrast
Comparison: Prior CTs from [DATE].

CLINICAL DATA: Initial evaluation for acute neuro deficit, stroke
suspected.

EXAM:
MRI HEAD WITHOUT CONTRAST
TECHNIQUE: Multiplanar, multiecho pulse sequences of the brain and surrounding
structures were obtained without intravenous contrast.

[Series 5: DWI · axial · 3.0mm · 0.88mm/px · z∈[-144,+7]mm · 8 of 106 slices shown (1 of 4)]
[im 1/106]
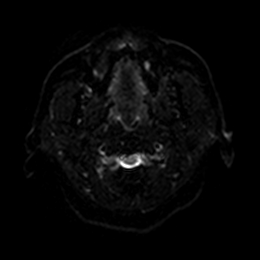
[im 16/106]
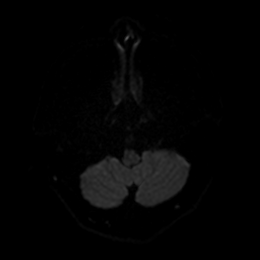
[im 31/106]
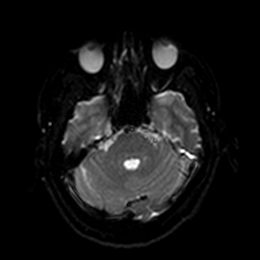
[im 46/106]
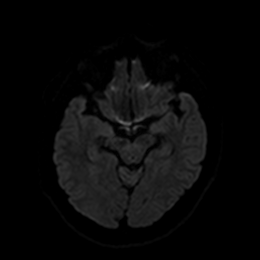
[im 61/106]
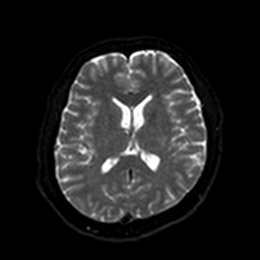
[im 76/106]
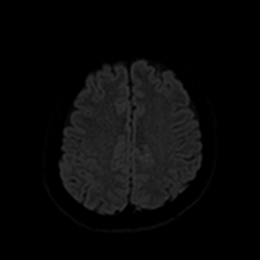
[im 91/106]
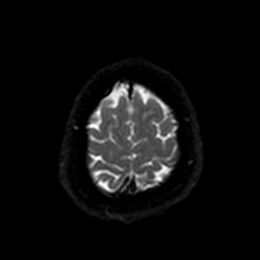
[im 106/106]
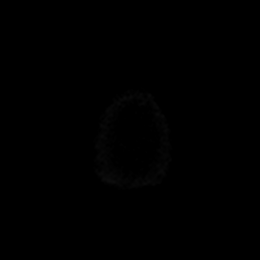

[Series 6: DWI · axial · 3.0mm · 0.88mm/px · z∈[-144,+7]mm · 4 of 53 slices shown (2 of 4)]
[im 1/53]
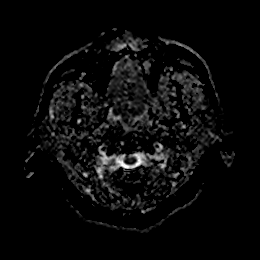
[im 18/53]
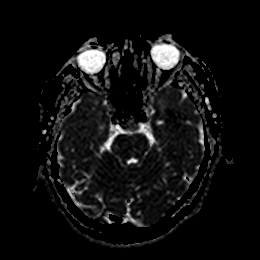
[im 35/53]
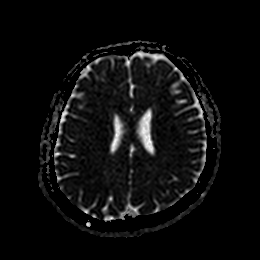
[im 53/53]
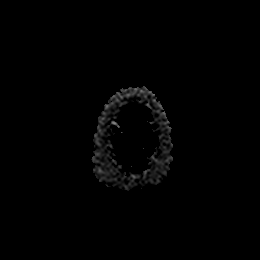

[Series 7: DWI · coronal · 4.0mm · 0.88mm/px · 5 of 68 slices shown (3 of 4)]
[im 1/68]
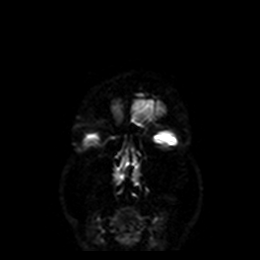
[im 17/68]
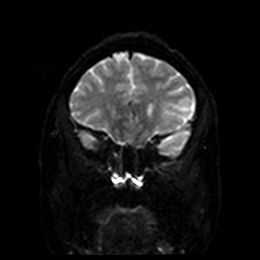
[im 34/68]
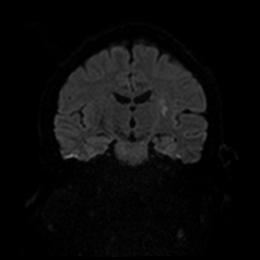
[im 51/68]
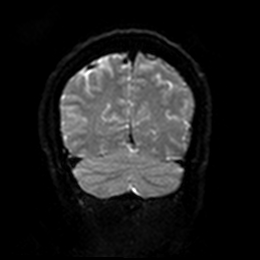
[im 68/68]
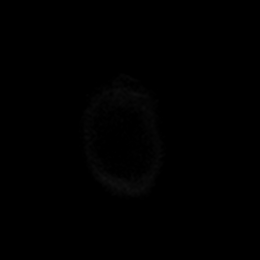

[Series 8: DWI · coronal · 4.0mm · 0.88mm/px · 3 of 34 slices shown (4 of 4)]
[im 1/34]
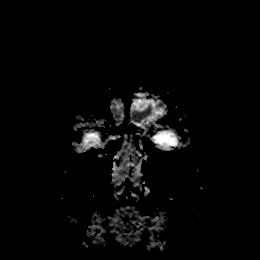
[im 17/34]
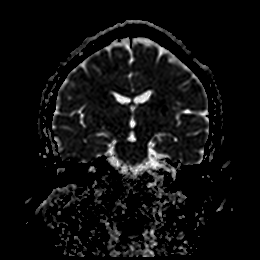
[im 34/34]
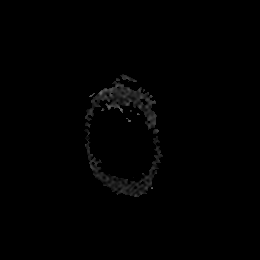

[Series 9: T1 · sagittal · 5.0mm · 0.75mm/px · 2 of 25 slices shown]
[im 1/25]
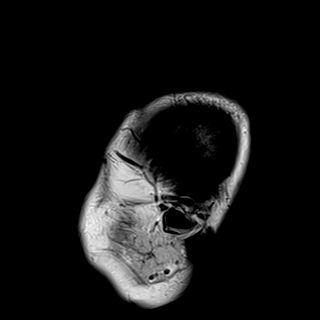
[im 25/25]
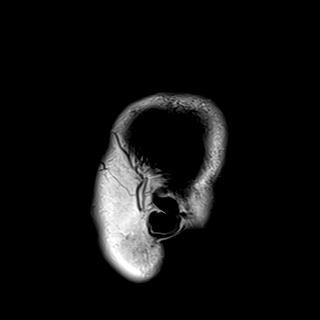

[Series 10: T2 · axial · 5.0mm · 0.72mm/px · z∈[-140,+19]mm · 2 of 28 slices shown (1 of 2)]
[im 1/28]
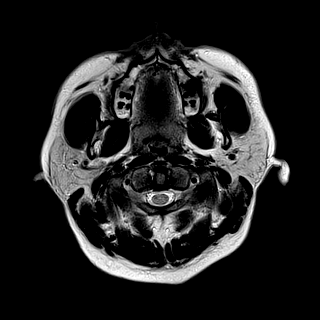
[im 28/28]
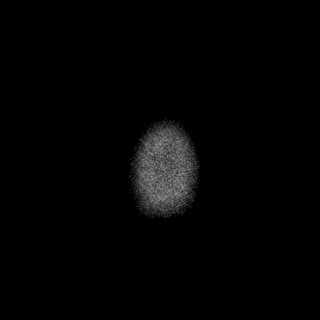

[Series 11: FLAIR · axial · 5.0mm · 0.45mm/px · z∈[-138,+21]mm · 2 of 28 slices shown]
[im 1/28]
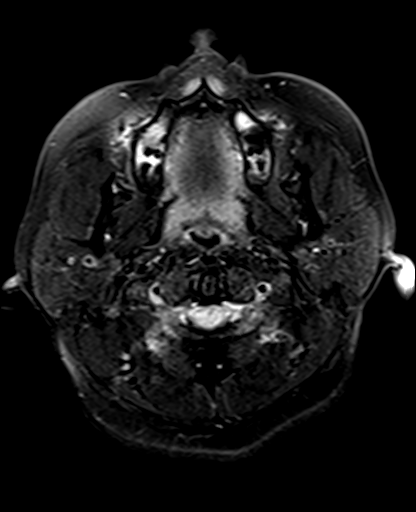
[im 28/28]
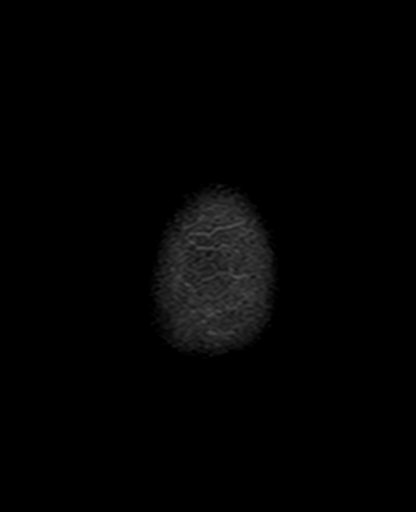

[Series 12: mag_images · axial · 3.0mm · 0.90mm/px · z∈[-147,+13]mm · 4 of 56 slices shown]
[im 1/56]
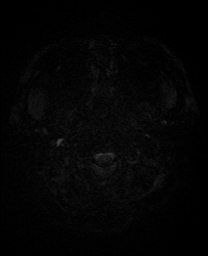
[im 19/56]
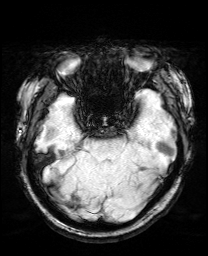
[im 37/56]
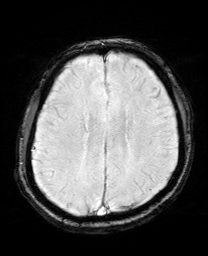
[im 56/56]
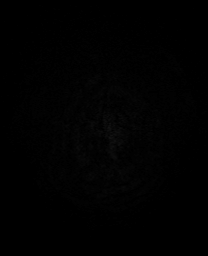

[Series 13: pha_images · axial · 3.0mm · 0.90mm/px · z∈[-147,+7]mm · 4 of 53 slices shown]
[im 1/53]
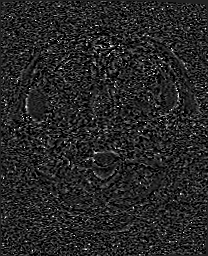
[im 18/53]
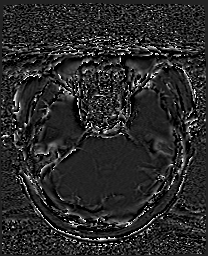
[im 35/53]
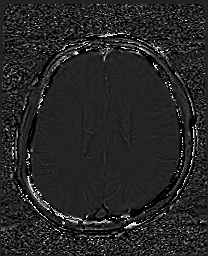
[im 53/53]
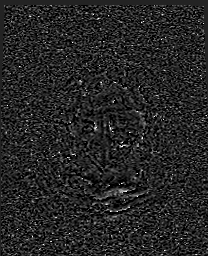

[Series 14: swi_images · axial · 3.0mm · 0.90mm/px · z∈[-147,+13]mm · 4 of 56 slices shown]
[im 1/56]
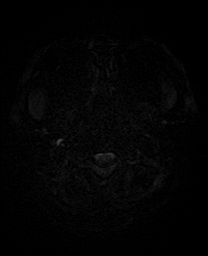
[im 19/56]
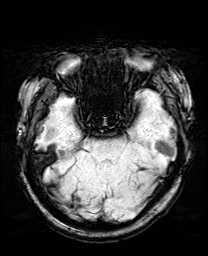
[im 37/56]
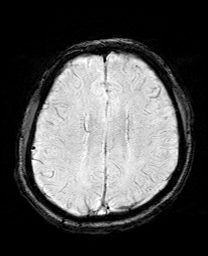
[im 56/56]
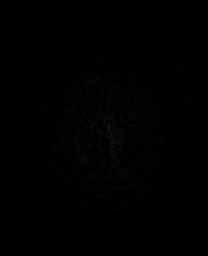

[Series 15: mip_images(sw) · axial · 24.0mm · 0.90mm/px · z∈[-137,+3]mm · 4 of 49 slices shown]
[im 1/49]
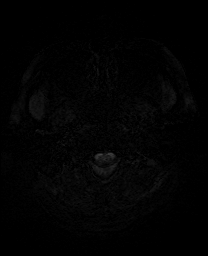
[im 17/49]
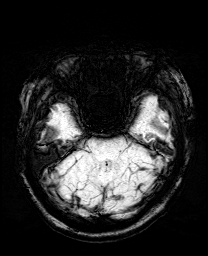
[im 33/49]
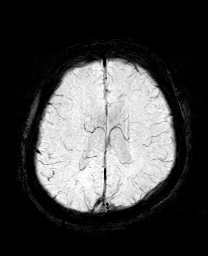
[im 49/49]
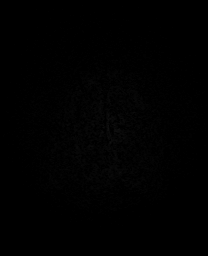

[Series 17: T2 · coronal · 5.0mm · 0.34mm/px · 2 of 29 slices shown (2 of 2)]
[im 1/29]
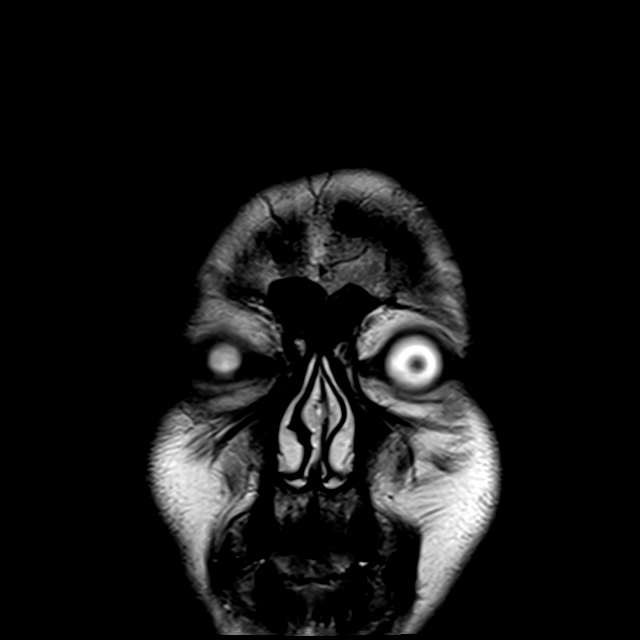
[im 29/29]
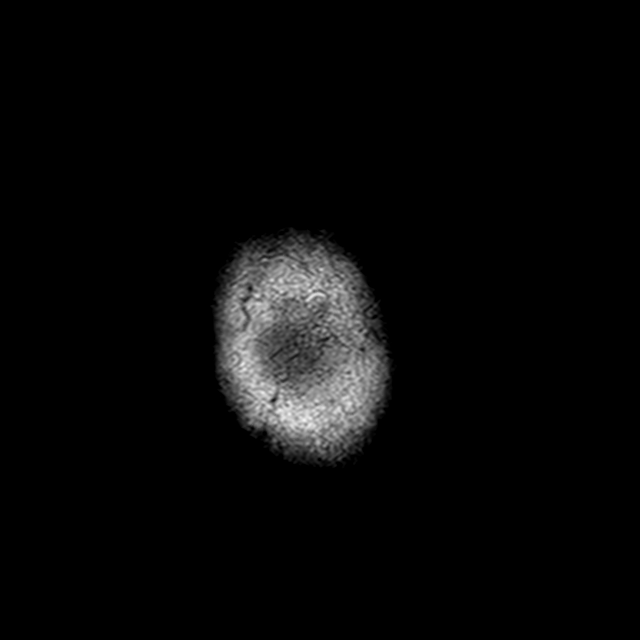

[44 of 48 positions shown; findings below may reference images not displayed]

FINDINGS: Brain: Cerebral volume within normal limits for age. No focal
parenchymal signal abnormality. No significant cerebral white matter
disease.

Approximate 2 cm curvilinear perforator type infarcts seen extending
from the posterior left lentiform nucleus towards the left corona
radiata (series 5, image 86). No associated hemorrhage or mass
effect. No other evidence for acute or subacute ischemia. Gray-white
matter differentiation otherwise maintained. No encephalomalacia to
suggest chronic cortical infarction elsewhere within the brain. No
foci of susceptibility artifact to suggest acute or chronic
intracranial hemorrhage.

No mass lesion, midline shift or mass effect. No hydrocephalus or
extra-axial fluid collection.

4 mm T1 hyperintense lesion noted within the central aspect of the
pituitary gland, indeterminate, but suspected to reflect a small
pars intermedius cyst with internal proteinaceous material and/or
blood products. Pituitary gland and suprasellar region otherwise
unremarkable. Midline structures intact.

Vascular: Major intracranial vascular flow voids are maintained.

Skull and upper cervical spine: Craniocervical junction normal. Bone
marrow signal intensity normal. No scalp soft tissue abnormality.
Plagiocephaly noted.

Sinuses/Orbits: Globes and orbital soft tissues within normal
limits. Small left maxillary sinus retention cyst. Paranasal sinuses
are otherwise clear. No significant mastoid effusion. Inner ear
structures grossly normal.

Other: None.
IMPRESSION: 1. 2 cm curvilinear acute ischemic perforator type infarct extending
from the posterior left lentiform nucleus towards the left corona
radiata. No associated hemorrhage or mass effect.
2. Otherwise negative brain MRI.

## 2020-07-16 MED ORDER — CLOPIDOGREL BISULFATE 75 MG PO TABS
75.0000 mg | ORAL_TABLET | Freq: Every day | ORAL | Status: DC
  Administered 2020-07-16 – 2020-07-17 (×2): 75 mg via ORAL
  Filled 2020-07-16 (×2): qty 1

## 2020-07-16 MED ORDER — ASPIRIN EC 81 MG PO TBEC
81.0000 mg | DELAYED_RELEASE_TABLET | Freq: Every day | ORAL | Status: DC
  Administered 2020-07-16 – 2020-07-17 (×2): 81 mg via ORAL
  Filled 2020-07-16 (×2): qty 1

## 2020-07-16 MED ORDER — ATORVASTATIN CALCIUM 40 MG PO TABS: 80.0000 mg | ORAL_TABLET | Freq: Every day | ORAL | Status: DC

## 2020-07-16 MED ORDER — ATORVASTATIN CALCIUM 40 MG PO TABS
40.0000 mg | ORAL_TABLET | Freq: Every day | ORAL | Status: DC
  Administered 2020-07-17: 40 mg via ORAL
  Filled 2020-07-16: qty 1

## 2020-07-16 MED ORDER — INFLUENZA VAC SPLIT QUAD 0.5 ML IM SUSY: 0.5000 mL | PREFILLED_SYRINGE | INTRAMUSCULAR | Status: DC

## 2020-07-16 MED ORDER — PNEUMOCOCCAL VAC POLYVALENT 25 MCG/0.5ML IJ INJ: 0.5000 mL | INJECTION | INTRAMUSCULAR | Status: DC

## 2020-07-16 MED ORDER — SODIUM CHLORIDE (PF) 0.9 % IJ SOLN
INTRAMUSCULAR | Status: AC
  Filled 2020-07-16: qty 10
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
## 2020-07-16 NOTE — Progress Notes (Addendum)
Pt is voicing complaints about his head feeling light and off balance in standing, and noted his trouble with controlling his sitting posture on a forward leaning task.  PT and OT saw together as pt is unsteady, moderate loss of touch sensation on R foot and lower leg.  Pt is mildly weak on RLE and has moderate incoordination.  Follow acutely for gait training, balance challenges, and work on RLE strength and coordination.  BP checks were done:  Sitting 162/100, standing 134/100, post gait 133/99.   Pt is motivated and is in agreement to ck on CIR for follow up with all goals of PT.    07/16/20 1400  PT Visit Information  Last PT Received On 07/16/20  Assistance Needed +1  Reason for Co-Treatment Complexity of the patient's impairments (multi-system involvement);For patient/therapist safety  OT goals addressed during session Proper use of Adaptive equipment and DME;Strengthening/ROM  History of Present Illness 39 year old man PMH smoker presented to the emergency department with right upper and right lower extremity numbness.  Precautions  Precautions Fall  Precaution Comments monitor vitals  Restrictions  Weight Bearing Restrictions No  Other Position/Activity Restrictions R side is weak and lacking coordination  Home Living  Family/patient expects to be discharged to: Private residence  Living Arrangements Spouse/significant other;Children  Available Help at Discharge Available 24 hours/day  Type of Home House  Home Access Stairs to enter  Entrance Stairs-Number of Steps 3  Entrance Stairs-Rails None  Home Layout Two level;Bed/bath upstairs  Alternate Level Stairs-Number of Steps flight  Alternate Level Stairs-Rails Left  Bathroom Shower/Tub Tub/shower unit;Curtain  Horticulturist, commercial Yes  Home Equipment None  Additional Comments has family with him  Prior Function  Level of Independence Independent  Comments works in IT/coding  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  Pain Assessment  Pain Assessment No/denies pain  Cognition  Arousal/Alertness Awake/alert  Behavior During Therapy WFL for tasks assessed/performed  Overall Cognitive Status Within Functional Limits for tasks assessed  General Comments answers questions appropriately  Upper Extremity Assessment  Upper Extremity Assessment Defer to OT evaluation  Lower Extremity Assessment  Lower Extremity Assessment RLE deficits/detail  RLE Deficits / Details moderate incoordination at all RLE joints  RLE Sensation decreased light touch (light touch decreased over the bottom of R foot and R lower leg)  RLE Coordination decreased gross motor  Cervical / Trunk Assessment  Cervical / Trunk Assessment Normal  Bed Mobility  Overal bed mobility Needs Assistance  Bed Mobility Sit to Supine  Sit to supine Min guard;Min assist  General bed mobility comments minor help to lift RLE onto bed  Transfers  Overall transfer level Needs assistance  Equipment used Rolling walker (2 wheeled);1 person hand held assist  Transfers Sit to/from Stand  Sit to Stand Min assist  General transfer comment min assist for oscillating movement and feeling in RLE esp at hip  Ambulation/Gait  Ambulation/Gait assistance Min assist;+2 physical assistance;+2 safety/equipment (for initial walk)  Gait Distance (Feet) 20 Feet  Assistive device Rolling walker (2 wheeled);1 person hand held assist  Gait Pattern/deviations Step-to pattern;Step-through pattern;Decreased stride length;Decreased weight shift to right;Ataxic;Wide base of support  General Gait Details pt is turning to L side and setting up with dense cues to prep sitting from RW.  has limited sensation on R plantar surface, oscillating tone and movement quality to use R hip  Gait velocity reduced  Modified Rankin (Stroke Patients Only)  Pre-Morbid Rankin Score 0  Modified Rankin 3  General Comments  General comments (skin integrity, edema,  etc.) Pt is voicing and demonstrating changes on R side with sensation, strength and coordination, most of all coordination  Exercises  Exercises Other exercises (4- R hip, knee and ankle 4+)  PT - End of Session  Equipment Utilized During Treatment Gait belt  Activity Tolerance Patient tolerated treatment well;Treatment limited secondary to medical complications (Comment)  Patient left in bed;with call bell/phone within reach;with family/visitor present  Nurse Communication Mobility status  PT Assessment  PT Recommendation/Assessment Patient needs continued PT services  PT Visit Diagnosis Unsteadiness on feet (R26.81);Muscle weakness (generalized) (M62.81);Ataxic gait (R26.0)  PT Problem List Decreased strength;Decreased activity tolerance;Decreased balance;Decreased mobility;Decreased coordination;Decreased knowledge of use of DME;Cardiopulmonary status limiting activity;Impaired sensation  Barriers to Discharge Inaccessible home environment;Decreased caregiver support  Barriers to Discharge Comments home with family but wife is working  PT Plan  PT Frequency (ACUTE ONLY) Min 3X/week  PT Treatment/Interventions (ACUTE ONLY) DME instruction;Gait training;Functional mobility training;Therapeutic activities;Therapeutic exercise;Balance training;Neuromuscular re-education;Patient/family education;Stair training  AM-PAC PT "6 Clicks" Mobility Outcome Measure (Version 2)  Help needed turning from your back to your side while in a flat bed without using bedrails? 3  Help needed moving from lying on your back to sitting on the side of a flat bed without using bedrails? 3  Help needed moving to and from a bed to a chair (including a wheelchair)? 3  Help needed standing up from a chair using your arms (e.g., wheelchair or bedside chair)? 3  Help needed to walk in hospital room? 3  Help needed climbing 3-5 steps with a railing?  2  6 Click Score 17  Consider Recommendation of Discharge To: Home with  [facility]  PT Recommendation  Recommendations for Other Services Rehab consult  Follow Up Recommendations CIR  PT equipment Rolling walker with 5" wheels  Individuals Consulted  Consulted and Agree with Results and Recommendations Patient  Acute Rehab PT Goals  Patient Stated Goal to get better, walk alone  PT Goal Formulation With patient  Time For Goal Achievement 07/30/20  Potential to Achieve Goals Good  PT Time Calculation  PT Start Time (ACUTE ONLY) 1125  PT Stop Time (ACUTE ONLY) 1159  PT Time Calculation (min) (ACUTE ONLY) 34 min  PT General Charges  $$ ACUTE PT VISIT 1 Visit  PT Evaluation  $PT Eval Moderate Complexity 1 Mod  Written Expression  Dominant Hand Right    Samul Dada, PT MS Acute Rehab Dept. Number: [facility] [id number] and [facility] [phone]

## 2020-07-16 NOTE — Progress Notes (Signed)
TCD bubble study has been completed.   Preliminary results in CV Proc.   Blanch Media 07/16/2020 3:23 PM

## 2020-07-16 NOTE — Progress Notes (Signed)
PROGRESS NOTE  Frank Cummings IOX:[id number] DOB: November 25, 1980 DOA: 07/15/2020 PCP: Patient, No Pcp Per  Brief History   39 year old man PMH smoker presented to the emergency department with right upper and right lower extremity numbness.  Imaging revealed acute ischemic stroke, left ICA web, admitted for further stroke evaluation.   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  DVT prophylaxis: enoxaparin (LOVENOX) injection 40 mg Start: 07/15/20 2315   Code Status: Full Code Family Communication: brother at bedside  Brendia Sacks, MD  [facility] Direct contact: see [email or web address] (further directions at bottom of note if needed) 7PM-7AM contact night coverage as at bottom of note 07/16/2020, 9:27 AM  LOS: 0 days   Significant Hospital Events   .    Consults:  .    Procedures:  .   Significant Diagnostic Tests:  Marland Kitchen    Micro Data:  .    Antimicrobials:  .   Interval History/Subjective  CC: f/u right weakness  Feels ok today, no pain, no difficulty swallowing.  Mild slurred speech.  Numbness in the right arm and right leg persists.  Weaker on the right than on the  left.  Objective   Vitals:  Vitals:   07/16/20 0645 07/16/20 0730  BP: 114/75 112/72  Pulse: 68 72  Resp: 20 (!) 22  Temp:  (!) 97.2 F (36.2 C)  SpO2: 98% 96%    Exam:  Constitutional:   . Appears calm and comfortable lying on stretcher in the emergency department ENMT:  . grossly normal hearing  Respiratory:  . CTA bilaterally, no w/r/r.  . Respiratory effort normal.  Cardiovascular:  . RRR, no m/r/g . No LE extremity edema   Musculoskeletal:  . Left upper extremity, left lower extremity o strength and tone normal, no atrophy, no abnormal movements o No tenderness, masses . Right upper extremity, right lower extremity decreased strength, gross sensation decreased Neurologic:  . CN 2-12 appear grossly intact . Diminished sensation grossly on the right by report . Some dysdiadochokinesis right upper extremity Psychiatric:  . Mental status o Mood, affect appropriate  I have personally reviewed the following:   Today's Data  . Urine drug screen unremarkable . LDL 135, triglycerides 262 . A1c 5.3  Scheduled Meds: .  stroke: mapping our early stages of recovery book   Does not apply Once  . aspirin  300 mg Rectal Daily   Or  . aspirin  325 mg Oral Daily  . atorvastatin  80 mg Oral Daily  . enoxaparin (LOVENOX) injection  40 mg Subcutaneous QHS   Continuous Infusions:  Principal Problem:   Acute ischemic stroke (HCC) Active  Problems:   Right sided numbness   Carotid artery disease (HCC)   Hyperlipemia   Hypertriglyceridemia   LOS: 0 days   How to contact the [facility] Attending or Consulting provider 7A - 7P or covering provider during after hours 7P -7A, for this patient?  1. Check the care team in [facility] and look for a) attending/consulting [facility] provider listed and b) the [facility] team listed 2. Log into [email or web address] and use [place]'s universal password to access. If you do not have the password, please contact the hospital operator. 3. Locate the [facility] provider  you are looking for under [facility] and page to a number that you can be directly reached. 4. If you still have difficulty reaching the provider, please page the [facility] (Director on Call) for the Hospitalists listed on amion for assistance.

## 2020-07-16 NOTE — ED Notes (Signed)
Breakfast ordered 

## 2020-07-16 NOTE — ED Notes (Signed)
Family at bedside. 

## 2020-07-16 NOTE — Progress Notes (Addendum)
STROKE TEAM PROGRESS NOTE   INTERVAL HISTORY His brother is at the bedside. MRI results reviewed with pt/family. Recent long car ride noted, but no current DVT complaints. Given young age will check Echo and LE [place].   Vitals:   07/16/20 0615 07/16/20 0630 07/16/20 0645 07/16/20 0730  BP: 121/75 126/74 114/75 112/72  Pulse: 71 71 68 72  Resp: (!) _0 (!) 22  Temp:    (!) 97.2 F (36.2 C)  TempSrc:    Temporal  SpO2: 97% 97% 98% 96%  Weight:       CBC:  Recent Labs  Lab 07/15/20 2004 07/15/20 2015  WBC 7.6  --   NEUTROABS 4.8  --   HGB 16.2 16.7  HCT 49.5 49.0  MCV 85.9  --   PLT 103*  --    Basic Metabolic Panel:  Recent Labs  Lab 07/15/20 2004 07/15/20 2015  NA 138 139  K 3.9 4.1  CL 103 103  CO2 26  --   GLUCOSE 96 91  BUN 10 11  CREATININE 0.83 0.90  CALCIUM 9.7  --   Lipid Panel:  Recent Labs  Lab 07/16/20 0555  CHOL 224*  TRIG 262*  HDL 37*  CHOLHDL 6.1  VLDL 52*  LDLCALC 135*   HgbA1c:  Recent Labs  Lab 07/16/20 0555  HGBA1C 5.3   Urine Drug Screen:  Recent Labs  Lab 07/16/20 0729  LABOPIA NONE DETECTED  COCAINSCRNUR NONE DETECTED  LABBENZ NONE DETECTED  AMPHETMU NONE DETECTED  THCU NONE DETECTED  LABBARB NONE DETECTED    Alcohol Level No results for input(s): ETH in the last 168 hours.  IMAGING past 24 hours DG Chest 2 View  Result Date: 07/15/2020 CLINICAL DATA:  39 year old male with acute onset right extremity numbness. Code stroke. EXAM: CHEST - 2 VIEW COMPARISON:  CTA head and neck today. FINDINGS: Upright AP and lateral views of the chest. Low normal lung volumes. Normal cardiac size and mediastinal contours. Visualized tracheal air column is within normal limits. No pneumothorax, pulmonary edema, pleural effusion or confluent pulmonary opacity. No osseous abnormality identified. Negative visible bowel gas pattern. IMPRESSION: No acute cardiopulmonary abnormality. Electronically Signed   By: Genevie Ann M.D.   On: 07/15/2020 23:34    MR BRAIN WO CONTRAST  Result Date: 07/16/2020 CLINICAL DATA:  Initial evaluation for acute neuro deficit, stroke suspected. EXAM: MRI HEAD WITHOUT CONTRAST TECHNIQUE: Multiplanar, multiecho pulse sequences of the brain and surrounding structures were obtained without intravenous contrast. COMPARISON:  Prior CTs from 07/15/2020. FINDINGS: Brain: Cerebral volume within normal limits for age. No focal parenchymal signal abnormality. No significant cerebral white matter disease. Approximate 2 cm curvilinear perforator type infarcts seen extending from the posterior left lentiform nucleus towards the left corona radiata (series 5, image 86). No associated hemorrhage or mass effect. No other evidence for acute or subacute ischemia. Gray-white matter differentiation otherwise maintained. No encephalomalacia to suggest chronic cortical infarction elsewhere within the brain. No foci of susceptibility artifact to suggest acute or chronic intracranial hemorrhage. No mass lesion, midline shift or mass effect. No hydrocephalus or extra-axial fluid collection. 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 Pituitary gland and suprasellar region otherwise unremarkable. Midline structures intact. Vascular: Major intracranial vascular flow voids are maintained. Skull and upper cervical spine: Craniocervical junction normal. Bone marrow signal intensity normal. No  scalp soft tissue abnormality. Plagiocephaly noted. Sinuses/Orbits: Globes and orbital soft tissues within normal limits. Small left maxillary sinus retention cyst. Paranasal sinuses are otherwise clear. No significant mastoid effusion. Inner ear structures grossly normal. Other: None. IMPRESSION: 1. 2 cm curvilinear acute ischemic perforator type infarct extending from the posterior left lentiform  nucleus towards the left corona radiata. No associated hemorrhage or mass effect. 2. Otherwise negative brain MRI. Electronically Signed   By: Jeannine Boga M.D.   On: 07/16/2020 02:35   CT HEAD CODE STROKE WO CONTRAST  Addendum Date: 07/15/2020   ADDENDUM REPORT: 07/15/2020 20:30 ADDENDUM: Findings discussed with Dr. Theda Cummings at 8:27 PM via telephone. Electronically Signed   By: Margaretha Sheffield MD   On: 07/15/2020 20:30   Result Date: 07/15/2020 CLINICAL DATA:  Code stroke. Neuro deficit, acute stroke suspected. Sudden onset of left-sided numbness. EXAM: CT HEAD WITHOUT CONTRAST TECHNIQUE: Contiguous axial images were obtained from the base of the skull through the vertex without intravenous contrast. COMPARISON:  None. FINDINGS: Brain: No evidence of acute large vascular territory infarction, hemorrhage, hydrocephalus, extra-axial collection or mass lesion/mass effect. Vascular: No hyperdense vessel or unexpected calcification. Skull: No acute fracture. Sinuses/Orbits: Visualized sinuses are clear. No acute orbital abnormality. Other: No mastoid effusions. ASPECTS [facility] Stroke Program Early CT Score) total score (0-10 with 10 being normal): 10 IMPRESSION: No evidence of acute intracranial abnormality. ASPECTS is 10. Dr. Theda Cummings was paged at the time of dictation. Electronically Signed: By: Margaretha Sheffield MD On: 07/15/2020 20:19   CT ANGIO HEAD CODE STROKE  Result Date: 07/15/2020 CLINICAL DATA:  Left face numbness and right arm numbness. EXAM: CT ANGIOGRAPHY HEAD AND NECK TECHNIQUE: Multidetector CT imaging of the head and neck was performed using the standard protocol during bolus administration of intravenous contrast. Multiplanar CT image reconstructions and MIPs were obtained to evaluate the vascular anatomy. Carotid stenosis measurements (when applicable) are obtained utilizing NASCET criteria, using the distal internal carotid diameter as the denominator. CONTRAST:  57m OMNIPAQUE  IOHEXOL 350 MG/ML SOLN COMPARISON:  Same day CT head. FINDINGS: CTA NECK FINDINGS Aortic arch: Imaged portion shows no evidence of aneurysm or dissection. No significant stenosis of the major arch vessel origins. Right carotid system: No evidence of dissection, stenosis (50% or greater) or occlusion. Left carotid system: No evidence of dissection, stenosis (50% or greater) or occlusion. There is a shelf-like filling defect within the posterior aspect of the left internal carotid artery origin, compatible with a carotid web (see series 9, image 133). Vertebral arteries: Mildly left dominant. No evidence of dissection, stenosis (50% or greater) or occlusion. Skeleton: No acute fracture. Other neck: No mass or suspicious adenopathy. Upper chest: No acute abnormality. Review of the MIP images confirms the above findings CTA HEAD FINDINGS Anterior circulation: No significant stenosis, proximal occlusion, aneurysm, or vascular malformation. Posterior circulation: No significant stenosis, proximal occlusion, aneurysm, or vascular malformation. Venous sinuses: As permitted by contrast timing, patent. Review of the MIP images confirms the above findings IMPRESSION: 1. No large vessel occlusion or hemodynamically significant proximal stenosis in the head or neck. 2. 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 This finding has been reported as having an increased risk of ischemic stroke. Findings discussed with Dr. CTheda Sersat 8:43 p.m. via telephone. Electronically Signed   By: FMargaretha SheffieldMD   On: 07/15/2020 20:46   CT ANGIO NECK CODE STROKE  Result Date: 07/15/2020 CLINICAL DATA:  Left face numbness and right arm numbness. EXAM: CT  ANGIOGRAPHY HEAD AND NECK TECHNIQUE: Multidetector CT imaging of the head and neck was performed using the standard protocol during bolus administration of intravenous contrast. Multiplanar CT image reconstructions and MIPs were obtained to evaluate the vascular anatomy.  Carotid stenosis measurements (when applicable) are obtained utilizing NASCET criteria, using the distal internal carotid diameter as the denominator. CONTRAST:  41m OMNIPAQUE IOHEXOL 350 MG/ML SOLN COMPARISON:  Same day CT head. FINDINGS: CTA NECK FINDINGS Aortic arch: Imaged portion shows no evidence of aneurysm or dissection. No significant stenosis of the major arch vessel origins. Right carotid system: No evidence of dissection, stenosis (50% or greater) or occlusion. Left carotid system: No evidence of dissection, stenosis (50% or greater) or occlusion. There is a shelf-like filling defect within the posterior aspect of the left internal carotid artery origin, compatible with a carotid web (see series 9, image 133). Vertebral arteries: Mildly left dominant. No evidence of dissection, stenosis (50% or greater) or occlusion. Skeleton: No acute fracture. Other neck: No mass or suspicious adenopathy. Upper chest: No acute abnormality. Review of the MIP images confirms the above findings CTA HEAD FINDINGS Anterior circulation: No significant stenosis, proximal occlusion, aneurysm, or vascular malformation. Posterior circulation: No significant stenosis, proximal occlusion, aneurysm, or vascular malformation. Venous sinuses: As permitted by contrast timing, patent. Review of the MIP images confirms the above findings IMPRESSION: 1. No large vessel occlusion or hemodynamically significant proximal stenosis in the head or neck. 2. 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 This finding has been reported as having an increased risk of ischemic stroke. Findings discussed with Dr. CTheda Sersat 8:43 p.m. via telephone. Electronically Signed   By: FMargaretha SheffieldMD   On: 07/15/2020 20:46    PHYSICAL EXAM General: Appears well-developed. Psych: Affect appropriate to situation Eyes: No scleral injection HENT: No OP obstrucion Head: Normocephalic.  Cardiovascular: Normal rate and regular  rhythm. Respiratory: Effort normal and breath sounds normal to anterior ascultation GI: Soft.  No distension. There is no tenderness.  Skin: WDI    Neurological Examination Mental Status: Alert, oriented, thought content appropriate.  Speech fluent without evidence of aphasia. Able to follow 3 step commands without difficulty. Cranial Nerves: II: Visual fields grossly normal,  III,IV, VI: ptosis not present, extra-ocular motions intact bilaterally, pupils equal, round, reactive to light and accommodation V,VII: smile symmetric, facial light touch sensation normal bilaterally VIII: hearing normal bilaterally IX,X: uvula rises symmetrically XI: bilateral shoulder shrug XII: midline tongue extension Motor: Right : Upper extremity   5/5    Left:     Upper extremity   5/5  Lower extremity   5/5, slight drift   Lower extremity   5/5 Tone and bulk:normal tone throughout; no atrophy noted.  Diminished fine finger movements on the right.  Orbits left over right upper extremity.  Trace weakness of right hip flexors and ankle dorsiflexors on double simultaneous testing. Sensory: WNL on left. Right has decreased light touch on right arm/leg Deep Tendon Reflexes: 2+ and symmetric throughout Plantars: Right: downgoing   Left: downgoing Cerebellar: normal finger-to-nose, normal rapid alternating movements and normal heel-to-shin test Gait: not tested  ASSESSMENT/PLAN Mr. Frank Chenaultis a 39y.o. male with history of Bell's palsy in 2017 (resolved) high cholesterol, takes no meds. 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   Stroke:  Left subcortical stroke, given size, location and abnormal LICA web, this is likely  embolic (cardioemboic vs large vessle embolic etiology from possible LICA web abnormality under investigation Code Stroke CT head No acute abnormality. ASPECTS 10.     CTA head & neck:  No  large vessel occlusion or hemodynamically significant proximal stenosis in the head or neck. 2. 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 This finding has been reported as having an increased risk of ischemic stroke, thus we will check a CUS to better evaluate for this  MRI:  2 cm curvilinear acute ischemic perforator type infarct extending from the posterior left lentiform nucleus towards the left corona radiata.   Carotid Doppler  pending  2D Echo w/Bubble: pending  LE [place]: pending  Recent long car ride noted, but no current DVT complaints. Given young age will check Echo for PFO and LE [place].   LDL 135  HgbA1c 5.3  VTE prophylaxis - lovenox    Diet   Diet Heart Room service appropriate? Yes; Fluid consistency: Thin     none prior to admission, now on ASA 50m + Plavix 718mPO daily x3 weeks, then ASA monotherapy  Therapy recommendations:  pending  Disposition:  pending  Hypertension  Home meds:  none  Stable . Permissive hypertension (OK if < 220/120) but gradually normalize in 5-7 days . Long-term BP goal normotensive  Hyperlipidemia  Home meds:  none,   LDL 135, goal < 70  Lipitor 4041mO daily started  Continue statin at discharge  NO Diabetes   HgbA1c 5.3, goal < 7.0  CBGs Recent Labs    07/15/20 2043  GLUCAP 89      SSI  Other Stroke Risk Factors  None  Other Active Problems  Low PLT: 103, will need to continue to monitor  Hospital day # 0  Desiree Metzger-Cihelka, ARNP-C, ANVP-BC Pager: [phone] personally obtained history,examined this patient, reviewed notes, independently viewed imaging studies, participated in medical decision making and plan of care.ROS completed by me personally and pertinent positives fully documented  I have made any additions or clarifications directly to the above note. Agree with note above. Patient presented with right hemiparesis secondary to large left basal ganglia infarct likely of  cryptogenic etiology.  CT angiogram shows left proximal ICA web which is not flow-limiting or associated with the clot.  This has been associated with increased stroke risk but definitely therapy with surgery will have to wait till other etiologies have been ruled out.  Recommend transcranial Doppler bubble study to look for PFO.  TEE to look for other cardiac source of embolism.  Continue telemetry monitoring.  Check antiphospholipid antibodies, ANA panel and ESR.  Aspirin Plavix for 3 weeks followed by aspirin alone and statin for his elevated lipids.  Long discussion with patient and his cousin at the bedside and answered questions.  Discussed with Dr. ChrFortunato Curlingscular surgeon Greater than 50% time during this 35-minute visit was spent on counseling and coordination of care about his cryptogenic stroke and discussion about evaluation treatment plan and answering questions. PraAntony ContrasD Medical Director [facility] Pager: [phone]/04/2020 4:51 PM  To contact Stroke Continuity provider, please refer to [email or web address], contact General Neurology

## 2020-07-16 NOTE — ED Notes (Signed)
Pt transported to MRI. Unable to obtain vitals at this time. Vitals will be obtained once pt is back from MRI.

## 2020-07-16 NOTE — ED Notes (Signed)
Pt returned from MRI °

## 2020-07-16 NOTE — Progress Notes (Signed)
  Echocardiogram 2D Echocardiogram has been performed.  Pieter Partridge 07/16/2020, 1:44 PM

## 2020-07-16 NOTE — Progress Notes (Signed)
Occupational Therapy Evaluation  PTA pt lives independently with his wife and 2 children (5 & 9) and works in Consulting civil engineer. Pt presents with apparent sensory motor deficits, resulting in ataxic gait pattern and balance deficits. Requires mod A with gait without use of AE and min A with ADL. Pt complaining of "blurry" vision. Feel pt is an excellent candidate for intensive rehab at CIR to facilitate safe DC home and maximize functional level of independence. Very supportive family. Pt very motivated to regain his independence. Will follow acutely.    07/16/20 1100  OT Visit Information  Last OT Received On 07/16/20  Assistance Needed +1  PT/OT/SLP Co-Evaluation/Treatment Yes (partial session)  Reason for Co-Treatment To address functional/ADL transfers  OT goals addressed during session ADL's and self-care  History of Present Illness 39 year old man PMH smoker presented to the emergency department with right upper and right lower extremity numbness.  Precautions  Precautions Fall  Precaution Comments allow permissive HTN 210/110  Home Living  Family/patient expects to be discharged to: Private residence  Living Arrangements Spouse/significant other;Children  Available Help at Discharge Available 24 hours/day  Type of Home House  Home Access Stairs to enter  Entrance Stairs-Number of Steps 3  Entrance Stairs-Rails None  Home Layout Two level;Bed/bath upstairs  Alternate Level Stairs-Number of Steps flight  Alternate Level Stairs-Rails Left  Bathroom Shower/Tub Tub/shower unit;Curtain  Horticulturist, commercial Yes  How Accessible Accessible via walker  Home Equipment None  Prior Function  Level of Independence Independent  Comments works in IT/coding  Geneticist, molecular No difficulties  Pain Assessment  Pain Assessment No/denies pain  Cognition  Arousal/Alertness Awake/alert  Behavior During Therapy WFL for tasks assessed/performed  General Comments  Will further assess higher level cognition; Pt reports being "confused" when the event happened. States he feels"foggy"  Upper Extremity Assessment  Upper Extremity Assessment RUE deficits/detail  RUE Deficits / Details AROM WFL with diminshed fine motor skills and gross coordination; apparent sensory motor dysfunction; dropping items frequently  RUE Sensation decreased light touch;decreased proprioception  RUE Coordination decreased fine motor;decreased gross motor  Lower Extremity Assessment  Lower Extremity Assessment Defer to PT evaluation  Cervical / Trunk Assessment  Cervical / Trunk Assessment Normal  ADL  Overall ADL's  Needs assistance/impaired  Eating/Feeding Set up;Sitting  Eating/Feeding Details (indicate cue type and reason) unable to feed self with dominant hand; assist to open packages  Grooming Minimal assistance;Sitting  Upper Body Bathing Set up;Sitting  Lower Body Bathing Minimal assistance;Sit to/from stand  Upper Body Dressing  Minimal assistance;Sitting  Lower Body Dressing Minimal assistance;Sit to/from Scientist, research (life sciences) Moderate assistance  Toileting- Clothing Manipulation and Hygiene Minimal assistance  Functional mobility during ADLs Moderate assistance  General ADL Comments apparent ataxia affecting functional ability  Vision- History  Baseline Vision/History No visual deficits  Patient Visual Report Blurring of vision  Vision- Assessment  Vision Assessment? Yes  Eye Alignment WFL  Ocular Range of Motion [facility]  Alignment/Gaze Preference WDL  Tracking/Visual Pursuits Able to track stimulus in all quads without difficulty  Saccades Decreased speed of saccadic movement  Visual Fields No apparent deficits  Additional Comments Pt states his vision has improved but not at baseline; "blurry". font and brightness adjusted on cell phone to increase his ability to read texts  Perception  Comments appears [facility]  Praxis  Praxis tested? WFL  Bed Mobility  Overal  bed mobility Needs Assistance  Bed Mobility Sit to Supine;Supine to Sit  Supine to  sit Min assist;HOB elevated  Sit to supine Min guard  Transfers  Overall transfer level Needs assistance  Transfers Sit to/from Stand;Stand Pivot Transfers  Sit to Stand Min assist  Stand pivot transfers Min assist  Balance  Overall balance assessment Needs assistance  Sitting balance-Leahy Scale Fair  Standing balance-Leahy Scale Poor  Standing balance comment reliant on external support  General Comments  General comments (skin integrity, edema, etc.) brother presnt  Other Exercises  Other Exercises educated on eye-hand coordination activities with emphasis on controlled movement patterns  OT - End of Session  Equipment Utilized During Treatment Gait belt;Rolling walker  Activity Tolerance Patient tolerated treatment well  Patient left in bed;with call bell/phone within reach;with family/visitor present  Nurse Communication Mobility status  OT Assessment  OT Recommendation/Assessment Patient needs continued OT Services  OT Visit Diagnosis Unsteadiness on feet (R26.81);Other abnormalities of gait and mobility (R26.89);Muscle weakness (generalized) (M62.81);Ataxia, unspecified (R27.0);Low vision, both eyes (H54.2);Dizziness and giddiness (R42)  OT Problem List Decreased strength;Decreased range of motion;Decreased activity tolerance;Impaired balance (sitting and/or standing);Impaired vision/perception;Decreased coordination;Decreased safety awareness;Decreased knowledge of use of DME or AE;Impaired sensation;Impaired UE functional use  OT Plan  OT Frequency (ACUTE ONLY) Min 2X/week  OT Treatment/Interventions (ACUTE ONLY) Self-care/ADL training;Therapeutic exercise;Neuromuscular education;DME and/or AE instruction;Therapeutic activities;Cognitive remediation/compensation;Visual/perceptual remediation/compensation;Patient/family education;Balance training  AM-PAC OT "6 Clicks" Daily Activity Outcome  Measure (Version 2)  Help from another person eating meals? 3  Help from another person taking care of personal grooming? 3  Help from another person toileting, which includes using toliet, bedpan, or urinal? 3  Help from another person bathing (including washing, rinsing, drying)? 3  Help from another person to put on and taking off regular upper body clothing? 3  Help from another person to put on and taking off regular lower body clothing? 3  6 Click Score 18  OT Recommendation  Recommendations for Other Services Rehab consult  Follow Up Recommendations CIR;Supervision/Assistance - 24 hour  OT Equipment 3 in 1 bedside commode  Individuals Consulted  Consulted and Agree with Results and Recommendations Patient  Acute Rehab OT Goals  Patient Stated Goal To return to being independent  OT Goal Formulation With patient  Time For Goal Achievement 07/30/20  Potential to Achieve Goals Good  OT Time Calculation  OT Start Time (ACUTE ONLY) 1117  OT Stop Time (ACUTE ONLY) 1148  OT Time Calculation (min) 31 min  OT General Charges  $OT Visit 1 Visit  OT Evaluation  $OT Eval Moderate Complexity 1 Mod  Written Expression  Dominant Hand Right  Luisa Dago, OT/L   Acute OT Clinical Specialist Acute Rehabilitation Services Pager [phone] Office [phone]

## 2020-07-16 NOTE — ED Notes (Signed)
Pt provided meal tray at this time per request.

## 2020-07-16 NOTE — Progress Notes (Signed)
Lower extremity venous & Carotid duplex has been completed.   Preliminary results in CV Proc.   Blanch Media 07/16/2020 1:38 PM

## 2020-07-16 NOTE — Hospital Course (Signed)
39 year old man PMH smoker presented to the emergency department with right upper and right lower extremity numbness.  Imaging revealed acute ischemic stroke, left ICA web, admitted for further stroke evaluation.

## 2020-07-17 ENCOUNTER — Inpatient Hospital Stay (HOSPITAL_COMMUNITY): Payer: 59

## 2020-07-17 ENCOUNTER — Inpatient Hospital Stay (HOSPITAL_COMMUNITY): Payer: 59 | Admitting: Certified Registered"

## 2020-07-17 ENCOUNTER — Encounter (HOSPITAL_COMMUNITY): Payer: Self-pay | Admitting: Internal Medicine

## 2020-07-17 ENCOUNTER — Encounter (HOSPITAL_COMMUNITY)
Admission: EM | Disposition: A | Discharge status: Home / Self Care | Payer: Self-pay | Source: Home / Self Care | Attending: Family Medicine

## 2020-07-17 DIAGNOSIS — I639 Cerebral infarction, unspecified: Secondary | ICD-10-CM

## 2020-07-17 DIAGNOSIS — D696 Thrombocytopenia, unspecified: Secondary | ICD-10-CM

## 2020-07-17 DIAGNOSIS — R7401 Elevation of levels of liver transaminase levels: Secondary | ICD-10-CM

## 2020-07-17 HISTORY — DX: Thrombocytopenia, unspecified: D69.6

## 2020-07-17 HISTORY — DX: Elevation of levels of liver transaminase levels: R74.01

## 2020-07-17 HISTORY — PX: TEE WITHOUT CARDIOVERSION: SHX5443

## 2020-07-17 LAB — ANA W/REFLEX IF POSITIVE: Anti Nuclear Antibody (ANA): NEGATIVE

## 2020-07-17 SURGERY — ECHOCARDIOGRAM, TRANSESOPHAGEAL
Anesthesia: Monitor Anesthesia Care

## 2020-07-17 MED ORDER — ASPIRIN 81 MG PO TBEC: 81.0000 mg | DELAYED_RELEASE_TABLET | Freq: Every day | ORAL | Status: AC

## 2020-07-17 MED ORDER — PROPOFOL 10 MG/ML IV BOLUS
INTRAVENOUS | Status: DC | PRN
Start: 2020-07-17 — End: 2020-07-17
  Administered 2020-07-17: 30 mg via INTRAVENOUS
  Administered 2020-07-17: 20 mg via INTRAVENOUS

## 2020-07-17 MED ORDER — CLOPIDOGREL BISULFATE 75 MG PO TABS: 75.0000 mg | ORAL_TABLET | Freq: Every day | ORAL | 2 refills | Status: DC

## 2020-07-17 MED ORDER — CLOPIDOGREL BISULFATE 75 MG PO TABS: 75.0000 mg | ORAL_TABLET | Freq: Every day | ORAL | 0 refills | Status: DC

## 2020-07-17 MED ORDER — PROPOFOL 500 MG/50ML IV EMUL
INTRAVENOUS | Status: DC | PRN
  Administered 2020-07-17: 200 ug/kg/min via INTRAVENOUS

## 2020-07-17 MED ORDER — ATORVASTATIN CALCIUM 40 MG PO TABS: 40.0000 mg | ORAL_TABLET | Freq: Every day | ORAL | 2 refills | Status: AC

## 2020-07-17 MED ORDER — LACTATED RINGERS IV SOLN: INTRAVENOUS | Status: DC | PRN

## 2020-07-17 NOTE — Progress Notes (Signed)
PROGRESS NOTE  Frank Cummings XNA:[id number] DOB: 1980/09/10 DOA: 07/15/2020 PCP: Patient, No Pcp Per  Brief History   39 year old man PMH smoker presented to the emergency department with right upper and right lower extremity numbness.  Imaging revealed acute ischemic stroke, left ICA web, admitted for further stroke evaluation.   A & P  Acute ischemic subcortical stroke with associated right upper and right lower extremity weakness and numbness --Stroke evaluation completed.  Aspirin 81 mg indefinitely, Plavix 75 mg daily for 3 weeks.  Continue statin.  Note neurology reviewed TEE and TCD, reported as negative bubble, no further evaluation or treatment suggested. 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  Vascular surgery consultation appreciated. 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  Hyperlipidemia, hypertriglyceridemia --Statin initiated  Smoker --Recommend cessation  Disposition Plan:  Discussion: Discussed with neurology.  Plan as above.  Reevaluate therapy recommendations to be updated tomorrow.  If continues to improve may be able to discharge home, otherwise pursue CIR.   Dispo: The patient is from: Home              Anticipated d/c is to: Home              Anticipated d/c date is: 1 day              Patient currently is not medically stable to d/c.  DVT prophylaxis: enoxaparin (LOVENOX) injection 40 mg Start: 07/15/20 2315   Code Status: Full Code Family Communication: brother at bedside again 12/10  Brendia Sacks, MD  [facility] Direct contact: see [email or web address] (further directions at bottom of note if needed) 7PM-7AM contact night coverage as at bottom of note 07/17/2020, 4:08 PM  LOS: 1 day     Consults:  . Neurology . Vascular surgery  Procedures:  . TEE No visual evidence of PFO or ASD. However, bubbles study x 2 shows a few bubbles crossing IAS suggestive of very small PFO  Significant Diagnostic Tests:  .   Interval History/Subjective  CC: f/u right weakness  Feels better today in regard to strength in the arm and leg  Objective   Vitals:  Vitals:   07/17/20 1458 07/17/20 1525  BP: 108/79 113/80  Pulse: (!) 55 70  Resp: 19 18  Temp:  98.4 F (36.9 C)  SpO2: 96%     Exam:  Constitutional:   . Appears calm and comfortable ENMT:  . grossly normal hearing  Respiratory:  . CTA bilaterally, no w/r/r.  . Respiratory effort normal. Cardiovascular:  . RRR, no m/r/g . No LE extremity edema   Musculoskeletal:  . RUE, LUE, RLE, LLE   . Moves all extremities to command, strength does appear to be improved in the right arm and right leg compared to yesterday Neurologic:  . Speech is clear Psychiatric:  . Mental status o Mood, affect appropriate  I have personally reviewed the following:   Today's Data  . Antinuclear antibody negative . TEE noted  Scheduled Meds: .  stroke: mapping our early stages of recovery book   Does not apply Once  . aspirin EC  81 mg Oral Daily  . atorvastatin  40 mg Oral Daily  . clopidogrel  75 mg Oral Daily  . enoxaparin (LOVENOX) injection  40 mg Subcutaneous QHS  . influenza vac  split quadrivalent PF  0.5 mL Intramuscular Tomorrow-1000  . pneumococcal 23 valent vaccine  0.5 mL Intramuscular Tomorrow-1000   Continuous Infusions:  Principal Problem:   Acute ischemic stroke [facility]) Active Problems:   Right sided numbness   Carotid artery disease (HCC)   Hyperlipemia   Hypertriglyceridemia   CVA (cerebral vascular accident) (HCC)   LOS: 1 day   How to contact the [facility] Attending or Consulting provider 7A - 7P or covering provider during after hours 7P -7A, for this patient?  1. Check the care team in [facility] and look for  a) attending/consulting [facility] provider listed and b) the [facility] team listed 2. Log into [email or web address] and use [place]'s universal password to access. If you do not have the password, please contact the hospital operator. 3. Locate the [facility] provider you are looking for under [facility] and page to a number that you can be directly reached. 4. If you still have difficulty reaching the provider, please page the [facility] (Director on Call) for the Hospitalists listed on amion for assistance.

## 2020-07-17 NOTE — Progress Notes (Signed)
Inpatient Rehab Admissions Coordinator:   Consult received.  Pt off unit for TEE.  Note plans for possible CEA Monday.  Will check in with pt Monday or Tuesday pending plans for that.   Estill Dooms, PT, DPT Admissions Coordinator [phone] 07/17/20  2:24 PM

## 2020-07-17 NOTE — Discharge Summary (Addendum)
Physician Discharge Summary  Frank Cummings ZOX:[id number] DOB: 10/12/1980 DOA: 07/15/2020  PCP: Farris Has, MD  Admit date: 07/15/2020 Discharge date: 07/17/2020  Recommendations for Outpatient Follow-up:  1. Stroke.  Follow-up in 6 weeks with neurology. 2. Carotid web.  Follow-up with vascular surgery. 3. Elevated ALT, consider repeat CMP, consider hepatitis panel 4. Chronic thrombocytopenia of unknown etiology, consider outpatient evaluation 5. Echo showed LVEF 60-65% but hypokinesis of basal inferior wall, of unclear clinical significance. 6. Outpt PT and OT > discussed w/ Kemper Durie, [facility], she will coordinate w/ Dr. Vincente Liberty office for orders.    Follow-up Information    Cephus Shelling, MD. Schedule an appointment as soon as possible for a visit in 1 week(s).   Specialty: Vascular Surgery Contact information: [address] [place] [address] [phone]        Micki Riley, MD Follow up.   Specialties: Neurology, Radiology Why: office will contact you with appointment Contact information: [address] [place] [address] [phone]        Farris Has, MD. Schedule an appointment as soon as possible for a visit in 2 week(s).   Specialty: Family Medicine Contact information: [address] [place] [address] [phone]                Discharge Diagnoses: Principal diagnosis is #1 Principal Problem:   Acute ischemic stroke [facility] Active Problems:   Right sided numbness   Carotid artery disease (HCC)   Hyperlipemia   Hypertriglyceridemia   CVA (cerebral vascular accident) [facility]   Discharge Condition: improved Disposition: home w/ outpt PT, OT  Diet recommendation:  Diet Orders (From admission, onward)    Start     Ordered   07/17/20 0000  Diet - low sodium heart healthy        07/17/20 1809   07/15/20 2309  Diet Heart Room service appropriate? Yes; Fluid consistency: Thin  Diet effective now        Question Answer Comment  Room service appropriate? Yes   Fluid consistency: Thin      07/15/20 2309           Filed Weights   07/15/20 2015 07/17/20 1240  Weight: 75.1 kg 74.4 kg    HPI/Hospital Course:   39 year old man PMH smoker presented to the emergency department with right upper and right lower extremity numbness.  Imaging revealed acute ischemic stroke, left ICA web, admitted for further stroke evaluation.  Seen by neurology and vascular surgery, improved clinically, carotid endarterectomy was recommended but patient declined at this time.  He will follow-up with neurology and vascular surgery as an outpatient.  Initially CIR was recommended but patient progressed rapidly and recommended for outpatient PT. Pt desires to go home.  Acute ischemic subcortical stroke with associated right upper and right lower extremity weakness and numbness --Stroke evaluation completed.  Aspirin 81 mg indefinitely, Plavix 75 mg daily for 3 weeks.  Continue statin.  Note neurology reviewed TEE and TCD, reported as negative bubble, no further evaluation or treatment suggested. 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  Vascular surgery consultation appreciated. 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 --follow-up w/ Dr. Chestine Spore as outpt  Hyperlipidemia, hypertriglyceridemia --Statin initiated  Elevated ALT --check CMP in outpt setting,  consider hepatitis panel  Chronic thrombocytopenia --chronic per patient, possibly familial. Follow-up as outpatient w/ PCP.  Smoker --Recommend cessation   Consults:   Neurology  Vascular surgery  Procedures:   TEE Novisual evidence ofPFO or ASD.However, bubbles study x 2 shows a few bubbles  crossing IAS suggestive of very small PFO   Today's assessment: See same day progress note   Discharge Instructions  Discharge Instructions    Ambulatory referral to Neurology   Complete by: As directed    An appointment is requested in approximately: 6 weeks   Diet - low sodium heart healthy   Complete by: As directed    Discharge instructions   Complete by: As directed    Call your physician or seek immediate medical attention for confusion, weakness, numbness or worsening of condition.   Increase activity slowly   Complete by: As directed      Allergies as of 07/17/2020   No Known Allergies     Medication List    TAKE these medications   acetaminophen 325 MG tablet Commonly known as: TYLENOL Take 325 mg by mouth every 6 (six) hours as needed for mild pain.   aspirin 81 MG EC tablet Take 1 tablet (81 mg total) by mouth daily. Swallow whole. Start taking on: July 18, 2020   atorvastatin 40 MG tablet Commonly known as: LIPITOR Take 1 tablet (40 mg total) by mouth daily. Start taking on: July 18, 2020   clopidogrel 75 MG tablet Commonly known as: PLAVIX Take 1 tablet (75 mg total) by mouth daily. Take for 3 weeks only, then stop. Start taking on: July 18, 2020      No Known Allergies  The results of significant diagnostics from this hospitalization (including imaging, microbiology, ancillary and laboratory) are listed below for reference.    Significant Diagnostic Studies: DG Chest 2 View  Result Date: 07/15/2020 CLINICAL DATA:  39 year old male with acute onset right extremity numbness. Code stroke. EXAM: CHEST - 2 VIEW COMPARISON:  CTA head and neck today. FINDINGS: Upright AP and lateral views of the chest. Low normal lung volumes. Normal cardiac size and mediastinal contours. Visualized tracheal air column is within normal limits. No pneumothorax, pulmonary edema, pleural effusion or confluent pulmonary opacity. No osseous abnormality identified.  Negative visible bowel gas pattern. IMPRESSION: No acute cardiopulmonary abnormality. Electronically Signed   By: Odessa FlemingH  Hall M.D.   On: 07/15/2020 23:34   MR BRAIN WO CONTRAST  Result Date: 07/16/2020 CLINICAL DATA:  Initial evaluation for acute neuro deficit, stroke suspected. EXAM: MRI HEAD WITHOUT CONTRAST TECHNIQUE: Multiplanar, multiecho pulse sequences of the brain and surrounding structures were obtained without intravenous contrast. COMPARISON:  Prior CTs from 07/15/2020. FINDINGS: Brain: Cerebral volume within normal limits for age. No focal parenchymal signal abnormality. No significant cerebral white matter disease. Approximate 2 cm curvilinear perforator type infarcts seen extending from the posterior left lentiform nucleus towards the left corona radiata (series 5, image 86). No associated hemorrhage or mass effect. No other evidence for acute or subacute ischemia. Gray-white matter differentiation otherwise maintained. No encephalomalacia to suggest chronic cortical infarction elsewhere within the brain. No foci of susceptibility artifact to suggest acute or chronic intracranial hemorrhage. No mass lesion, midline shift or mass effect. No hydrocephalus or extra-axial fluid collection. 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 Pituitary gland and suprasellar region otherwise unremarkable.  Midline structures intact. Vascular: Major intracranial vascular flow voids are maintained. Skull and upper cervical spine: Craniocervical junction normal. Bone marrow signal intensity normal. No scalp soft tissue abnormality. Plagiocephaly noted. Sinuses/Orbits: Globes and orbital soft tissues within normal limits. Small left maxillary sinus retention cyst. Paranasal sinuses are otherwise clear. No significant mastoid effusion. Inner ear structures  grossly normal. Other: None. IMPRESSION: 1. 2 cm curvilinear acute ischemic perforator type infarct extending from the posterior left lentiform nucleus towards the left corona radiata. No associated hemorrhage or mass effect. 2. Otherwise negative brain MRI. Electronically Signed   By: Rise Mu M.D.   On: 07/16/2020 02:35   VAS [place] TRANSCRANIAL DOPPLER W BUBBLES  Result Date: 07/17/2020  Transcranial Doppler Indications: Stroke. Performing Technologist: Frank Cummings RVS  Examination Guidelines: A complete evaluation includes B-mode imaging, spectral Doppler, color Doppler, and power Doppler as needed of all accessible portions of each vessel. Bilateral testing is considered an integral part of a complete examination. Limited examinations for reoccurring indications may be performed as noted.  Summary: No HITS at rest or during Valsalva. Negative transcranial Doppler Bubble study with no evidence of right to left intracardiac communication.  A vascular evaluation was performed. The right middle cerebral artery was studied. An IV was inserted into the patient's right forearm . Verbal informed consent was obtained.  Negative TCD Bubble study *See table(s) above for TCD measurements and observations.  Diagnosing physician: Delia Heady MD Electronically signed by Delia Heady MD on 07/17/2020 at 12:57:04 PM.    Final    ECHOCARDIOGRAM COMPLETE  Result Date: 07/16/2020    ECHOCARDIOGRAM REPORT   Patient Name:   RAYKWON Donovan Date of Exam: 07/16/2020 Medical Rec #:  [id number]   Height: Accession #:    [id number]  Weight:       165.6 lb Date of Birth:  10-05-1980   BSA:          1.780 m Patient Age:    39 years    BP:           133/99 mmHg Patient Gender: M           HR:           89 bpm. Exam Location:  Inpatient Procedure: 2D Echo, Cardiac Doppler, Color Doppler and Saline Contrast Bubble            Study Indications:    CVA  History:        Patient has no prior history of Echocardiogram examinations.                  Risk Factors:Hypertension and Dyslipidemia. Bells Palsy.  Sonographer:    Frank Cummings Referring Phys: [phone] JARED M GARDNER IMPRESSIONS  1. HYpokinesis of the basal inferior wall. . Left ventricular ejection fraction, by estimation, is 60 to 65%. The left ventricle has normal functionLeft ventricular diastolic parameters are indeterminate.  2. Right ventricular systolic function is normal. The right ventricular size is normal.  3. The mitral valve is normal in structure. No evidence of mitral valve regurgitation.  4. The aortic valve is abnormal. Aortic valve regurgitation is mild. Mild aortic valve sclerosis is present, with no evidence of aortic valve stenosis.  5. The inferior vena cava is dilated in size with <50% respiratory variability, suggesting right atrial pressure of 15 mmHg. FINDINGS  Left Ventricle: HYpokinesis of the basal inferior wall. Left ventricular ejection fraction, by estimation, is 60 to 65%. The left ventricle has normal function. The left  ventricle demonstrates regional wall motion abnormalities. The left ventricular internal cavity size was normal in size. There is no left ventricular hypertrophy. Left ventricular diastolic parameters are indeterminate. Right Ventricle: The right ventricular size is normal. Right vetricular wall thickness was not assessed. Right ventricular systolic function is normal. Left Atrium: Left atrial size was normal in size. Right Atrium: Right atrial size was normal in size. Pericardium: There is no evidence of pericardial effusion. Mitral Valve: The mitral valve is normal in structure. No evidence of mitral valve regurgitation. Tricuspid Valve: The tricuspid valve is normal in structure. Tricuspid valve regurgitation is trivial. Aortic Valve: The aortic valve is abnormal. Aortic valve regurgitation is mild. Mild aortic valve sclerosis is present, with no evidence of aortic valve stenosis. Pulmonic Valve: The pulmonic valve was not well  visualized. Pulmonic valve regurgitation is not visualized. Aorta: The aortic root is normal in size and structure. Venous: The inferior vena cava is dilated in size with less than 50% respiratory variability, suggesting right atrial pressure of 15 mmHg. IAS/Shunts: The interatrial septum was not assessed. Agitated saline contrast was given intravenously to evaluate for intracardiac shunting.  LEFT VENTRICLE PLAX 2D LVIDd:         4.30 cm  Diastology LVIDs:         2.90 cm  LV e' medial:    5.44 cm/s LV PW:         1.10 cm  LV E/e' medial:  9.7 LV IVS:        1.10 cm  LV e' lateral:   11.60 cm/s LVOT diam:     1.90 cm  LV E/e' lateral: 4.5 LV SV:         39 LV SV Index:   22 LVOT Area:     2.84 cm  RIGHT VENTRICLE RV Basal diam:  2.40 cm RV S prime:     11.20 cm/s TAPSE (M-mode): 2.1 cm LEFT ATRIUM             Index       RIGHT ATRIUM          Index LA diam:        3.40 cm 1.91 cm/m  RA Area:     9.24 cm LA Vol (A2C):   15.0 ml 8.43 ml/m  RA Volume:   17.60 ml 9.89 ml/m LA Vol (A4C):   33.9 ml 19.04 ml/m LA Biplane Vol: 22.7 ml 12.75 ml/m  AORTIC VALVE LVOT Vmax:   71.10 cm/s LVOT Vmean:  43.300 cm/s LVOT VTI:    0.138 m  AORTA Ao Root diam: 2.90 cm MITRAL VALVE MV Area (PHT): 3.85 cm    SHUNTS MV Decel Time: 197 msec    Systemic VTI:  0.14 m MV E velocity: 52.70 cm/s  Systemic Diam: 1.90 cm MV A velocity: 58.70 cm/s MV E/A ratio:  0.90 Frank Pates MD Electronically signed by Frank Pates MD Signature Date/Time: 07/16/2020/3:51:31 PM    Final    CT HEAD CODE STROKE WO CONTRAST  Addendum Date: 07/15/2020   ADDENDUM REPORT: 07/15/2020 20:30 ADDENDUM: Findings discussed with Dr. Thomasena Edis at 8:27 PM via telephone. Electronically Signed   By: Feliberto Harts MD   On: 07/15/2020 20:30   Result Date: 07/15/2020 CLINICAL DATA:  Code stroke. Neuro deficit, acute stroke suspected. Sudden onset of left-sided numbness. EXAM: CT HEAD WITHOUT CONTRAST TECHNIQUE: Contiguous axial images were obtained from the base of  the skull through the vertex without intravenous contrast. COMPARISON:  None. FINDINGS:  Brain: No evidence of acute large vascular territory infarction, hemorrhage, hydrocephalus, extra-axial collection or mass lesion/mass effect. Vascular: No hyperdense vessel or unexpected calcification. Skull: No acute fracture. Sinuses/Orbits: Visualized sinuses are clear. No acute orbital abnormality. Other: No mastoid effusions. ASPECTS [facility] Stroke Program Early CT Score) total score (0-10 with 10 being normal): 10 IMPRESSION: No evidence of acute intracranial abnormality. ASPECTS is 10. Dr. Thomasena Edis was paged at the time of dictation. Electronically Signed: By: Feliberto Harts MD On: 07/15/2020 20:19   VAS US CAROTID  Result Date: 07/16/2020 Carotid Arterial Duplex Study Indications:       CVA. Risk Factors:      Hyperlipidemia. Comparison Study:  no prior Performing Technologist: Frank Cummings RVS  Examination Guidelines: A complete evaluation includes B-mode imaging, spectral Doppler, color Doppler, and power Doppler as needed of all accessible portions of each vessel. Bilateral testing is considered an integral part of a complete examination. Limited examinations for reoccurring indications may be performed as noted.  Right Carotid Findings: +----------+--------+--------+--------+------------------+--------+             PSV cm/s EDV cm/s Stenosis Plaque Description Comments  +----------+--------+--------+--------+------------------+--------+  CCA Prox   64       19                heterogenous                 +----------+--------+--------+--------+------------------+--------+  CCA Distal 86       23                heterogenous                 +----------+--------+--------+--------+------------------+--------+  ICA Prox   55       23       1-39%    heterogenous                 +----------+--------+--------+--------+------------------+--------+  ICA Distal 47       21                                              +----------+--------+--------+--------+------------------+--------+  ECA        80       14                                             +----------+--------+--------+--------+------------------+--------+ +----------+--------+-------+--------+-------------------+             PSV cm/s EDV cms Describe Arm Pressure (mmHG)  +----------+--------+-------+--------+-------------------+  Subclavian 74                                             +----------+--------+-------+--------+-------------------+ +---------+--------+--+--------+--+---------+  Vertebral PSV cm/s 58 EDV cm/s 24 Antegrade  +---------+--------+--+--------+--+---------+  Left Carotid Findings: +----------+--------+--------+--------+------------------+--------+             PSV cm/s EDV cm/s Stenosis Plaque Description Comments  +----------+--------+--------+--------+------------------+--------+  CCA Prox   118      27                heterogenous                 +----------+--------+--------+--------+------------------+--------+  CCA Distal 74       24                heterogenous                 +----------+--------+--------+--------+------------------+--------+  ICA Prox   59       28       1-39%    heterogenous                 +----------+--------+--------+--------+------------------+--------+  ICA Distal 53       25                                             +----------+--------+--------+--------+------------------+--------+  ECA        99       15                                             +----------+--------+--------+--------+------------------+--------+ +----------+--------+--------+--------+-------------------+             PSV cm/s EDV cm/s Describe Arm Pressure (mmHG)  +----------+--------+--------+--------+-------------------+  Subclavian 111                                             +----------+--------+--------+--------+-------------------+ +---------+--------+--+--------+--+---------+  Vertebral PSV cm/s 51 EDV cm/s 21 Antegrade   +---------+--------+--+--------+--+---------+   Summary: Right Carotid: Velocities in the right ICA are consistent with a 1-39% stenosis. Left Carotid: Velocities in the left ICA are consistent with a 1-39% stenosis. Vertebrals: Bilateral vertebral arteries demonstrate antegrade flow. *See table(s) above for measurements and observations.  Electronically signed by Sherald Hess MD on 07/16/2020 at 5:00:06 PM.    Final    VAS [place] LOWER EXTREMITY VENOUS (DVT)  Result Date: 07/16/2020  Lower Venous DVT Study Indications: Stroke.  Comparison Study: no prior Performing Technologist: Frank Cummings RVS  Examination Guidelines: A complete evaluation includes B-mode imaging, spectral Doppler, color Doppler, and power Doppler as needed of all accessible portions of each vessel. Bilateral testing is considered an integral part of a complete examination. Limited examinations for reoccurring indications may be performed as noted. The reflux portion of the exam is performed with the patient in reverse Trendelenburg.  +---------+---------------+---------+-----------+----------+--------------+  RIGHT     Compressibility Phasicity Spontaneity Properties Thrombus Aging  +---------+---------------+---------+-----------+----------+--------------+  CFV       Full            Yes       Yes                                    +---------+---------------+---------+-----------+----------+--------------+  SFJ       Full                                                             +---------+---------------+---------+-----------+----------+--------------+  FV Prox   Full                                                             +---------+---------------+---------+-----------+----------+--------------+  FV Mid    Full                                                             +---------+---------------+---------+-----------+----------+--------------+  FV Distal Full                                                              +---------+---------------+---------+-----------+----------+--------------+  PFV       Full                                                             +---------+---------------+---------+-----------+----------+--------------+  POP       Full            Yes       Yes                                    +---------+---------------+---------+-----------+----------+--------------+  PTV       Full                                                             +---------+---------------+---------+-----------+----------+--------------+  PERO      Full                                                             +---------+---------------+---------+-----------+----------+--------------+   +---------+---------------+---------+-----------+----------+--------------+  LEFT      Compressibility Phasicity Spontaneity Properties Thrombus Aging  +---------+---------------+---------+-----------+----------+--------------+  CFV       Full            Yes       Yes                                    +---------+---------------+---------+-----------+----------+--------------+  SFJ       Full                                                             +---------+---------------+---------+-----------+----------+--------------+  FV Prox   Full                                                             +---------+---------------+---------+-----------+----------+--------------+  FV Mid    Full                                                             +---------+---------------+---------+-----------+----------+--------------+  FV Distal Full                                                             +---------+---------------+---------+-----------+----------+--------------+  PFV       Full                                                             +---------+---------------+---------+-----------+----------+--------------+  POP       Full            Yes       Yes                                     +---------+---------------+---------+-----------+----------+--------------+  PTV       Full                                                             +---------+---------------+---------+-----------+----------+--------------+  PERO      Full                                                             +---------+---------------+---------+-----------+----------+--------------+     Summary: BILATERAL: - No evidence of deep vein thrombosis seen in the lower extremities, bilaterally. - No evidence of superficial venous thrombosis in the lower extremities, bilaterally. -No evidence of popliteal cyst, bilaterally.   *See table(s) above for measurements and observations. Electronically signed by Sherald Hess MD on 07/16/2020 at 5:02:41 PM.    Final    CT ANGIO HEAD CODE STROKE  Result Date: 07/15/2020 CLINICAL DATA:  Left face numbness and right arm numbness. EXAM: CT ANGIOGRAPHY HEAD AND NECK TECHNIQUE: Multidetector CT imaging of the head and neck was performed using the standard protocol during bolus administration of intravenous contrast. Multiplanar CT image reconstructions and MIPs were obtained to evaluate the vascular anatomy. Carotid stenosis measurements (when applicable) are obtained utilizing NASCET criteria, using the distal internal carotid diameter as the denominator. CONTRAST:  80mL OMNIPAQUE IOHEXOL 350 MG/ML SOLN COMPARISON:  Same day CT head. FINDINGS: CTA NECK FINDINGS Aortic arch: Imaged portion shows no evidence of aneurysm or dissection. No significant stenosis of the major arch vessel origins. Right carotid system: No evidence of dissection, stenosis (50% or greater) or occlusion. Left  carotid system: No evidence of dissection, stenosis (50% or greater) or occlusion. There is a shelf-like filling defect within the posterior aspect of the left internal carotid artery origin, compatible with a carotid web (see series 9, image 133). Vertebral arteries: Mildly left dominant. No evidence of  dissection, stenosis (50% or greater) or occlusion. Skeleton: No acute fracture. Other neck: No mass or suspicious adenopathy. Upper chest: No acute abnormality. Review of the MIP images confirms the above findings CTA HEAD FINDINGS Anterior circulation: No significant stenosis, proximal occlusion, aneurysm, or vascular malformation. Posterior circulation: No significant stenosis, proximal occlusion, aneurysm, or vascular malformation. Venous sinuses: As permitted by contrast timing, patent. Review of the MIP images confirms the above findings IMPRESSION: 1. No large vessel occlusion or hemodynamically significant proximal stenosis in the head or neck. 2. 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 This finding has been reported as having an increased risk of ischemic stroke. Findings discussed with Dr. Thomasena Edis at 8:43 p.m. via telephone. Electronically Signed   By: Feliberto Harts MD   On: 07/15/2020 20:46   CT ANGIO NECK CODE STROKE  Result Date: 07/15/2020 CLINICAL DATA:  Left face numbness and right arm numbness. EXAM: CT ANGIOGRAPHY HEAD AND NECK TECHNIQUE: Multidetector CT imaging of the head and neck was performed using the standard protocol during bolus administration of intravenous contrast. Multiplanar CT image reconstructions and MIPs were obtained to evaluate the vascular anatomy. Carotid stenosis measurements (when applicable) are obtained utilizing NASCET criteria, using the distal internal carotid diameter as the denominator. CONTRAST:  86mL OMNIPAQUE IOHEXOL 350 MG/ML SOLN COMPARISON:  Same day CT head. FINDINGS: CTA NECK FINDINGS Aortic arch: Imaged portion shows no evidence of aneurysm or dissection. No significant stenosis of the major arch vessel origins. Right carotid system: No evidence of dissection, stenosis (50% or greater) or occlusion. Left carotid system: No evidence of dissection, stenosis (50% or greater) or occlusion. There is a shelf-like filling defect within the  posterior aspect of the left internal carotid artery origin, compatible with a carotid web (see series 9, image 133). Vertebral arteries: Mildly left dominant. No evidence of dissection, stenosis (50% or greater) or occlusion. Skeleton: No acute fracture. Other neck: No mass or suspicious adenopathy. Upper chest: No acute abnormality. Review of the MIP images confirms the above findings CTA HEAD FINDINGS Anterior circulation: No significant stenosis, proximal occlusion, aneurysm, or vascular malformation. Posterior circulation: No significant stenosis, proximal occlusion, aneurysm, or vascular malformation. Venous sinuses: As permitted by contrast timing, patent. Review of the MIP images confirms the above findings IMPRESSION: 1. No large vessel occlusion or hemodynamically significant proximal stenosis in the head or neck. 2. 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 This finding has been reported as having an increased risk of ischemic stroke. Findings discussed with Dr. Thomasena Edis at 8:43 p.m. via telephone. Electronically Signed   By: Feliberto Harts MD   On: 07/15/2020 20:46    Microbiology: Recent Results (from the past 240 hour(s))  Resp Panel by RT-PCR (Flu A&B, Covid) Nasopharyngeal Swab     Status: None   Collection Time: 07/15/20 10:59 PM   Specimen: Nasopharyngeal Swab; Nasopharyngeal(NP) swabs in vial transport medium  Result Value Ref Range Status   SARS Coronavirus 2 by RT PCR NEGATIVE NEGATIVE Final    Comment: (NOTE) SARS-CoV-2 target nucleic acids are NOT DETECTED.  The SARS-CoV-2 RNA is generally detectable in upper respiratory specimens during the acute phase of infection. The lowest concentration of SARS-CoV-2 viral copies  this assay can detect is 138 copies/mL. A negative result does not preclude SARS-Cov-2 infection and should not be used as the sole basis for treatment or other patient management decisions. A negative result may occur with  improper specimen  collection/handling, submission of specimen other than nasopharyngeal swab, presence of viral mutation(s) within the areas targeted by this assay, and inadequate number of viral copies(<138 copies/mL). A negative result must be combined with clinical observations, patient history, and epidemiological information. The expected result is Negative.  Fact Sheet for Patients:  BloggerCourse.com  Fact Sheet for Healthcare Providers:  SeriousBroker.it  This test is no t yet approved or cleared by the [place] FDA and  has been authorized for detection and/or diagnosis of SARS-CoV-2 by FDA under an Emergency Use Authorization (EUA). This EUA will remain  in effect (meaning this test can be used) for the duration of the COVID-19 declaration under Section 564(b)(1) of the Act, 21 U.S.C.section 360bbb-3(b)(1), unless the authorization is terminated  or revoked sooner.       Influenza A by PCR NEGATIVE NEGATIVE Final   Influenza B by PCR NEGATIVE NEGATIVE Final    Comment: (NOTE) The Xpert Xpress SARS-CoV-2/FLU/RSV plus assay is intended as an aid in the diagnosis of influenza from Nasopharyngeal swab specimens and should not be used as a sole basis for treatment. Nasal washings and aspirates are unacceptable for Xpert Xpress SARS-CoV-2/FLU/RSV testing.  Fact Sheet for Patients: BloggerCourse.com  Fact Sheet for Healthcare Providers: SeriousBroker.it  This test is not yet approved or cleared by the [place] FDA and has been authorized for detection and/or diagnosis of SARS-CoV-2 by FDA under an Emergency Use Authorization (EUA). This EUA will remain in effect (meaning this test can be used) for the duration of the COVID-19 declaration under Section 564(b)(1) of the Act, 21 U.S.C. section 360bbb-3(b)(1), unless the authorization is terminated or revoked.  Performed at [facility], [address] [place], [place] [address]      Labs: Basic Metabolic Panel: Recent Labs  Lab 07/15/20 2004 07/15/20 2015  NA 138 139  K 3.9 4.1  CL 103 103  CO2 26  --   GLUCOSE 96 91  BUN 10 11  CREATININE 0.83 0.90  CALCIUM 9.7  --    Liver Function Tests: Recent Labs  Lab 07/15/20 2004  AST 30  ALT 70*  ALKPHOS 91  BILITOT 0.6  PROT 7.4  ALBUMIN 4.4   CBC: Recent Labs  Lab 07/15/20 2004 07/15/20 2015  WBC 7.6  --   NEUTROABS 4.8  --   HGB 16.2 16.7  HCT 49.5 49.0  MCV 85.9  --   PLT 103*  --    C CBG: Recent Labs  Lab 07/15/20 2043  GLUCAP 89    Principal Problem:   Acute ischemic stroke [facility]) Active Problems:   Right sided numbness   Carotid artery disease (HCC)   Hyperlipemia   Hypertriglyceridemia   CVA (cerebral vascular accident) [facility])   Time coordinating discharge: 45 minutes  Signed:  Brendia Sacks, MD  [facility]  07/17/2020, 6:27 PM

## 2020-07-17 NOTE — Anesthesia Postprocedure Evaluation (Signed)
Anesthesia Post Note  Patient: Frank Cummings  Procedure(s) Performed: TRANSESOPHAGEAL ECHOCARDIOGRAM (TEE) (N/A )     Patient location during evaluation: PACU Anesthesia Type: MAC Level of consciousness: awake and alert Pain management: pain level controlled Vital Signs Assessment: post-procedure vital signs reviewed and stable Respiratory status: spontaneous breathing, nonlabored ventilation, respiratory function stable and patient connected to nasal cannula oxygen Cardiovascular status: stable and blood pressure returned to baseline Postop Assessment: no apparent nausea or vomiting Anesthetic complications: no   No complications documented.  Last Vitals:  Vitals:   07/17/20 1458 07/17/20 1525  BP: 108/79 113/80  Pulse: (!) 55 70  Resp: 19 18  Temp:  36.9 C  SpO2: 96%     Last Pain:  Vitals:   07/17/20 1240  TempSrc: Oral  PainSc: 0-No pain                 Effie Berkshire

## 2020-07-17 NOTE — CV Procedure (Signed)
    TRANSESOPHAGEAL ECHOCARDIOGRAM   NAME:  Frank Cummings   MRN: [id number] DOB:  March 19, 1981   ADMIT DATE: 07/15/2020  INDICATIONS:  CVA  PROCEDURE:   Informed consent was obtained prior to the procedure. The risks, benefits and alternatives for the procedure were discussed and the patient comprehended these risks.  Risks include, but are not limited to, cough, sore throat, vomiting, nausea, somnolence, esophageal and stomach trauma or perforation, bleeding, low blood pressure, aspiration, pneumonia, infection, trauma to the teeth and death.    After a procedural time-out, the patient was sedated by the anesthesia service with IV diprivan. The transesophageal probe was inserted in the esophagus and stomach without difficulty and multiple views were obtained.    COMPLICATIONS:    There were no immediate complications.  FINDINGS:  LEFT VENTRICLE: EF = 60-65%. No regional wall motion abnormalities.  RIGHT VENTRICLE: Normal size and function.   LEFT ATRIUM: Normal  LEFT ATRIAL APPENDAGE: No thrombus.   RIGHT ATRIUM: Normal  AORTIC VALVE:  Trileaflet. Trivial AI  MITRAL VALVE:    Normal. Trivial MR  TRICUSPID VALVE: Normal. Trivial TR  PULMONIC VALVE: Grossly normal.  INTERATRIAL SEPTUM: No visual evidence of PFO or ASD. However, bubbles study x 2 shows a few bubbles crossing IAS suggestive of very small PFO  PERICARDIUM: No effusion  DESCENDING AORTA: No plaque   CONCLUSION:  No visual evidence of PFO or ASD. However, bubbles study x 2 shows a few bubbles crossing IAS suggestive of very small PFO  Khush Pasion,MD 2:42 PM

## 2020-07-17 NOTE — Interval H&P Note (Signed)
History and Physical Interval Note:  07/17/2020 2:40 PM  Frank Cummings  has presented today for surgery, with the diagnosis of STOKE.  The various methods of treatment have been discussed with the patient and family. After consideration of risks, benefits and other options for treatment, the patient has consented to  Procedure(s): TRANSESOPHAGEAL ECHOCARDIOGRAM (TEE) (N/A) as a surgical intervention.  The patient's history has been reviewed, patient examined, no change in status, stable for surgery.  I have reviewed the patient's chart and labs.  Questions were answered to the patient's satisfaction.     Frank Cummings

## 2020-07-17 NOTE — Anesthesia Procedure Notes (Signed)
Procedure Name: MAC Date/Time: 07/17/2020 2:21 PM Performed by: Imagene Riches, CRNA Pre-anesthesia Checklist: Patient identified, Emergency Drugs available, Suction available, Patient being monitored and Timeout performed Patient Re-evaluated:Patient Re-evaluated prior to induction Oxygen Delivery Method: Nasal cannula

## 2020-07-17 NOTE — Transfer of Care (Signed)
Immediate Anesthesia Transfer of Care Note  Patient: Frank Cummings  Procedure(s) Performed: TRANSESOPHAGEAL ECHOCARDIOGRAM (TEE) (N/A )  Patient Location: Endoscopy Unit  Anesthesia Type:General  Level of Consciousness: drowsy  Airway & Oxygen Therapy: Patient Spontanous Breathing and Patient connected to nasal cannula oxygen  Post-op Assessment: Report given to RN and Post -op Vital signs reviewed and stable  Post vital signs: Reviewed and stable  Last Vitals:  Vitals Value Taken Time  BP 94/61 07/17/20 1438  Temp    Pulse 65 07/17/20 1439  Resp 21 07/17/20 1439  SpO2 97 % 07/17/20 1439  Vitals shown include unvalidated device data.  Last Pain:  Vitals:   07/17/20 1240  TempSrc: Oral  PainSc: 0-No pain         Complications: No complications documented.

## 2020-07-17 NOTE — Progress Notes (Signed)
Physical Therapy Treatment Patient Details Name: Frank Cummings MRN: [id number] DOB: 1980-12-27 Today's Date: 07/17/2020    History of Present Illness 39 year old man PMH smoker presented to the emergency department with right upper and right lower extremity numbness.    PT Comments    Pt tolerates treatment well requiring reduced assistance with ambulation and transfers. Pt progresses to stair negotiation requiring PT cues for step-to pattern. Pt does demonstrate mild R knee hyperextension with fatigue during ambulation and will benefit from continued acute PT services to improve mobility quality and to reduce falls risk. PT updates recommendations to home with outpatient PT, a cane, and supervision from family for OOB activity as the pt would prefer to discharge home and has made great progress to this point.   Follow Up Recommendations  Outpatient PT (neuro preferably)     Equipment Recommendations  Cane    Recommendations for Other Services       Precautions / Restrictions Precautions Precautions: Fall Precaution Comments: monitor vitals Restrictions Weight Bearing Restrictions: No    Mobility  Bed Mobility Overal bed mobility: Needs Assistance Bed Mobility: Supine to Sit;Sit to Supine     Supine to sit: Supervision Sit to supine: Supervision      Transfers Overall transfer level: Needs assistance Equipment used: None Transfers: Sit to/from Stand Sit to Stand: Min guard            Ambulation/Gait Ambulation/Gait assistance: Min guard (close supervision with cane) Gait Distance (Feet): 100 Feet (100' x 2) Assistive device: Straight cane (initial 100' without device) Gait Pattern/deviations: Step-to pattern Gait velocity: reduced Gait velocity interpretation: <1.8 ft/sec, indicate of risk for recurrent falls General Gait Details: pt with short step-to gait, gait speed improving with increased distances. Pt with some mild R knee hyperextension with fatigue.  Improve stability with use of cane   Stairs Stairs: Yes Stairs assistance: Min guard Stair Management: No rails;Step to pattern Number of Stairs: 2 General stair comments: 2 sets of 2 steps   Wheelchair Mobility    Modified Rankin (Stroke Patients Only) Modified Rankin (Stroke Patients Only) Pre-Morbid Rankin Score: No symptoms Modified Rankin: Moderately severe disability     Balance Overall balance assessment: Needs assistance Sitting-balance support: No upper extremity supported;Feet supported Sitting balance-Leahy Scale: Good     Standing balance support: No upper extremity supported Standing balance-Leahy Scale: Fair                              Cognition Arousal/Alertness: Awake/alert Behavior During Therapy: WFL for tasks assessed/performed Overall Cognitive Status: Within Functional Limits for tasks assessed                                        Exercises      General Comments General comments (skin integrity, edema, etc.): VSS on RA      Pertinent Vitals/Pain Pain Assessment: No/denies pain    Home Living                      Prior Function            PT Goals (current goals can now be found in the care plan section) Acute Rehab PT Goals Patient Stated Goal: to get better, walk alone Progress towards PT goals: Progressing toward goals    Frequency    Min 3X/week  PT Plan Discharge plan needs to be updated    Co-evaluation              AM-PAC PT "6 Clicks" Mobility   Outcome Measure  Help needed turning from your back to your side while in a flat bed without using bedrails?: A Little Help needed moving from lying on your back to sitting on the side of a flat bed without using bedrails?: A Little Help needed moving to and from a bed to a chair (including a wheelchair)?: A Little Help needed standing up from a chair using your arms (e.g., wheelchair or bedside chair)?: A Little Help  needed to walk in hospital room?: A Little Help needed climbing 3-5 steps with a railing? : A Little 6 Click Score: 18    End of Session   Activity Tolerance: Patient tolerated treatment well Patient left: in bed;with call bell/phone within reach;with family/visitor present Nurse Communication: Mobility status PT Visit Diagnosis: Unsteadiness on feet (R26.81);Muscle weakness (generalized) (M62.81);Ataxic gait (R26.0)     Time: 5883-2549 PT Time Calculation (min) (ACUTE ONLY): 23 min  Charges:  $Gait Training: 23-37 mins                     Arlyss Gandy, PT, DPT Acute Rehabilitation Pager: [phone]    Arlyss Gandy 07/17/2020, 5:30 PM

## 2020-07-17 NOTE — Progress Notes (Signed)
PT Cancellation Note  Patient Details Name: Frank Cummings MRN: [id number] DOB: [birth year]   Cancelled Treatment:    Reason Eval/Treat Not Completed: Patient at procedure or test/unavailable. Pt recently returned to floor from TEE. RN requesting PT hold at this time. PT will attempt to follow up as time allows.   Arlyss Gandy 07/17/2020, 3:42 PM

## 2020-07-17 NOTE — Progress Notes (Signed)
SLP Cancellation Note  Patient Details Name: Frank Cummings MRN: [id number] DOB: [birth year]   Cancelled treatment:       Reason Eval/Treat Not Completed: Patient at procedure or test/unavailable, Will follow up for cognitive-linguistic evaluation as pt is available   Maryjane Hurter 07/17/2020, 1:33 PM

## 2020-07-17 NOTE — Progress Notes (Signed)
STROKE TEAM PROGRESS NOTE   INTERVAL HISTORY His brother is at the bedside and has many questions about wk up and findings. I have d/w both pt and family the details of the case. I was joined by Dr Chestine Sporelark and we discussed CEA as well if TEE is neg and no other etiology is found for stroke. TEE is planned for today.  Symptoms have improved some today.  Vitals:   07/16/20 2234 07/17/20 0131 07/17/20 0437 07/17/20 0754  BP: 134/83 120/90 114/70 126/88  Pulse: 83 74 61 67  Resp: 16 12 14 16   Temp: 98.2 F (36.8 C) 97.9 F (36.6 C) 97.6 F (36.4 C) 98.5 F (36.9 C)  TempSrc: Oral Oral Oral Oral  SpO2: 98% 97% 97% 92%  Weight:       CBC:  Recent Labs  Lab 07/15/20 2004 07/15/20 2015  WBC 7.6  --   NEUTROABS 4.8  --   HGB 16.2 16.7  HCT 49.5 49.0  MCV 85.9  --   PLT 103*  --    Basic Metabolic Panel:  Recent Labs  Lab 07/15/20 2004 07/15/20 2015  NA 138 139  K 3.9 4.1  CL 103 103  CO2 26  --   GLUCOSE 96 91  BUN 10 11  CREATININE 0.83 0.90  CALCIUM 9.7  --   Lipid Panel:  Recent Labs  Lab 07/16/20 0555  CHOL 224*  TRIG 262*  HDL 37*  CHOLHDL 6.1  VLDL 52*  LDLCALC 135*   HgbA1c:  Recent Labs  Lab 07/16/20 0555  HGBA1C 5.3   Urine Drug Screen:  Recent Labs  Lab 07/16/20 0729  LABOPIA NONE DETECTED  COCAINSCRNUR NONE DETECTED  LABBENZ NONE DETECTED  AMPHETMU NONE DETECTED  THCU NONE DETECTED  LABBARB NONE DETECTED    Alcohol Level No results for input(s): ETH in the last 168 hours.  IMAGING past 24 hours VAS US TRANSCRANIAL DOPPLER W BUBBLES  Result Date: 07/16/2020  Transcranial Doppler Indications: Stroke. Performing Technologist: Blanch MediaMegan Riddle RVS  Examination Guidelines: A complete evaluation includes B-mode imaging, spectral Doppler, color Doppler, and power Doppler as needed of all accessible portions of each vessel. Bilateral testing is considered an integral part of a complete examination. Limited examinations for reoccurring indications may  be performed as noted.  Summary: No HITS at rest or during Valsalva. Negative transcranial Doppler Bubble study with no evidence of right to left intracardiac communication.  A vascular evaluation was performed. The right middle cerebral artery was studied. An IV was inserted into the patient's right forearm . Verbal informed consent was obtained.  *See table(s) above for TCD measurements and observations.    Preliminary    ECHOCARDIOGRAM COMPLETE  Result Date: 07/16/2020    ECHOCARDIOGRAM REPORT   Patient Name:   Frank Cummings Date of Exam: 07/16/2020 Medical Rec #:  [id number]   Height: Accession #:    [id number]  Weight:       165.6 lb Date of Birth:  [birth year]   BSA:          1.780 m Patient Age:    39 years    BP:           133/99 mmHg Patient Gender: M           HR:           89 bpm. Exam Location:  Inpatient Procedure: 2D Echo, Cardiac Doppler, Color Doppler and Saline Contrast Bubble  Study Indications:    CVA  History:        Patient has no prior history of Echocardiogram examinations.                 Risk Factors:Hypertension and Dyslipidemia. Bells Palsy.  Sonographer:    Lavenia Atlas Referring Phys: [phone] JARED M GARDNER IMPRESSIONS  1. HYpokinesis of the basal inferior wall. . Left ventricular ejection fraction, by estimation, is 60 to 65%. The left ventricle has normal functionLeft ventricular diastolic parameters are indeterminate.  2. Right ventricular systolic function is normal. The right ventricular size is normal.  3. The mitral valve is normal in structure. No evidence of mitral valve regurgitation.  4. The aortic valve is abnormal. Aortic valve regurgitation is mild. Mild aortic valve sclerosis is present, with no evidence of aortic valve stenosis.  5. The inferior vena cava is dilated in size with <50% respiratory variability, suggesting right atrial pressure of 15 mmHg. FINDINGS  Left Ventricle: HYpokinesis of the basal inferior wall. Left ventricular ejection fraction, by  estimation, is 60 to 65%. The left ventricle has normal function. The left ventricle demonstrates regional wall motion abnormalities. The left ventricular internal cavity size was normal in size. There is no left ventricular hypertrophy. Left ventricular diastolic parameters are indeterminate. Right Ventricle: The right ventricular size is normal. Right vetricular wall thickness was not assessed. Right ventricular systolic function is normal. Left Atrium: Left atrial size was normal in size. Right Atrium: Right atrial size was normal in size. Pericardium: There is no evidence of pericardial effusion. Mitral Valve: The mitral valve is normal in structure. No evidence of mitral valve regurgitation. Tricuspid Valve: The tricuspid valve is normal in structure. Tricuspid valve regurgitation is trivial. Aortic Valve: The aortic valve is abnormal. Aortic valve regurgitation is mild. Mild aortic valve sclerosis is present, with no evidence of aortic valve stenosis. Pulmonic Valve: The pulmonic valve was not well visualized. Pulmonic valve regurgitation is not visualized. Aorta: The aortic root is normal in size and structure. Venous: The inferior vena cava is dilated in size with less than 50% respiratory variability, suggesting right atrial pressure of 15 mmHg. IAS/Shunts: The interatrial septum was not assessed. Agitated saline contrast was given intravenously to evaluate for intracardiac shunting.  LEFT VENTRICLE PLAX 2D LVIDd:         4.30 cm  Diastology LVIDs:         2.90 cm  LV e' medial:    5.44 cm/s LV PW:         1.10 cm  LV E/e' medial:  9.7 LV IVS:        1.10 cm  LV e' lateral:   11.60 cm/s LVOT diam:     1.90 cm  LV E/e' lateral: 4.5 LV SV:         39 LV SV Index:   22 LVOT Area:     2.84 cm  RIGHT VENTRICLE RV Basal diam:  2.40 cm RV S prime:     11.20 cm/s TAPSE (M-mode): 2.1 cm LEFT ATRIUM             Index       RIGHT ATRIUM          Index LA diam:        3.40 cm 1.91 cm/m  RA Area:     9.24 cm LA Vol  (A2C):   15.0 ml 8.43 ml/m  RA Volume:   17.60 ml 9.89 ml/m LA Vol (A4C):  33.9 ml 19.04 ml/m LA Biplane Vol: 22.7 ml 12.75 ml/m  AORTIC VALVE LVOT Vmax:   71.10 cm/s LVOT Vmean:  43.300 cm/s LVOT VTI:    0.138 m  AORTA Ao Root diam: 2.90 cm MITRAL VALVE MV Area (PHT): 3.85 cm    SHUNTS MV Decel Time: 197 msec    Systemic VTI:  0.14 m MV E velocity: 52.70 cm/s  Systemic Diam: 1.90 cm MV A velocity: 58.70 cm/s MV E/A ratio:  0.90 Dietrich Pates MD Electronically signed by Dietrich Pates MD Signature Date/Time: 07/16/2020/3:51:31 PM    Final    VAS US CAROTID  Result Date: 07/16/2020 Carotid Arterial Duplex Study Indications:       CVA. Risk Factors:      Hyperlipidemia. Comparison Study:  no prior Performing Technologist: Blanch Media RVS  Examination Guidelines: A complete evaluation includes B-mode imaging, spectral Doppler, color Doppler, and power Doppler as needed of all accessible portions of each vessel. Bilateral testing is considered an integral part of a complete examination. Limited examinations for reoccurring indications may be performed as noted.  Right Carotid Findings: +----------+--------+--------+--------+------------------+--------+           PSV cm/sEDV cm/sStenosisPlaque DescriptionComments +----------+--------+--------+--------+------------------+--------+ CCA Prox  64      19              heterogenous               +----------+--------+--------+--------+------------------+--------+ CCA Distal86      23              heterogenous               +----------+--------+--------+--------+------------------+--------+ ICA Prox  55      23      1-39%   heterogenous               +----------+--------+--------+--------+------------------+--------+ ICA Distal47      21                                         +----------+--------+--------+--------+------------------+--------+ ECA       80      14                                          +----------+--------+--------+--------+------------------+--------+ +----------+--------+-------+--------+-------------------+           PSV cm/sEDV cmsDescribeArm Pressure (mmHG) +----------+--------+-------+--------+-------------------+ [id number]                                         +----------+--------+-------+--------+-------------------+ +---------+--------+--+--------+--+---------+ VertebralPSV cm/s58EDV cm/s24Antegrade +---------+--------+--+--------+--+---------+  Left Carotid Findings: +----------+--------+--------+--------+------------------+--------+           PSV cm/sEDV cm/sStenosisPlaque DescriptionComments +----------+--------+--------+--------+------------------+--------+ CCA Prox  118     27              heterogenous               +----------+--------+--------+--------+------------------+--------+ CCA Distal74      24              heterogenous               +----------+--------+--------+--------+------------------+--------+ ICA Prox  59      28      1-39%   heterogenous               +----------+--------+--------+--------+------------------+--------+  ICA Distal53      25                                         +----------+--------+--------+--------+------------------+--------+ ECA       99      15                                         +----------+--------+--------+--------+------------------+--------+ +----------+--------+--------+--------+-------------------+           PSV cm/sEDV cm/sDescribeArm Pressure (mmHG) +----------+--------+--------+--------+-------------------+ [id number]                                         +----------+--------+--------+--------+-------------------+ +---------+--------+--+--------+--+---------+ VertebralPSV cm/s51EDV cm/s21Antegrade +---------+--------+--+--------+--+---------+   Summary: Right Carotid: Velocities in the right ICA are consistent with a 1-39% stenosis. Left  Carotid: Velocities in the left ICA are consistent with a 1-39% stenosis. Vertebrals: Bilateral vertebral arteries demonstrate antegrade flow. *See table(s) above for measurements and observations.  Electronically signed by Sherald Hess MD on 07/16/2020 at 5:00:06 PM.    Final    VAS [place] LOWER EXTREMITY VENOUS (DVT)  Result Date: 07/16/2020  Lower Venous DVT Study Indications: Stroke.  Comparison Study: no prior Performing Technologist: Blanch Media RVS  Examination Guidelines: A complete evaluation includes B-mode imaging, spectral Doppler, color Doppler, and power Doppler as needed of all accessible portions of each vessel. Bilateral testing is considered an integral part of a complete examination. Limited examinations for reoccurring indications may be performed as noted. The reflux portion of the exam is performed with the patient in reverse Trendelenburg.  +---------+---------------+---------+-----------+----------+--------------+ RIGHT    CompressibilityPhasicitySpontaneityPropertiesThrombus Aging +---------+---------------+---------+-----------+----------+--------------+ CFV      Full           Yes      Yes                                 +---------+---------------+---------+-----------+----------+--------------+ SFJ      Full                                                        +---------+---------------+---------+-----------+----------+--------------+ FV Prox  Full                                                        +---------+---------------+---------+-----------+----------+--------------+ FV Mid   Full                                                        +---------+---------------+---------+-----------+----------+--------------+ FV DistalFull                                                        +---------+---------------+---------+-----------+----------+--------------+  PFV      Full                                                         +---------+---------------+---------+-----------+----------+--------------+ POP      Full           Yes      Yes                                 +---------+---------------+---------+-----------+----------+--------------+ PTV      Full                                                        +---------+---------------+---------+-----------+----------+--------------+ PERO     Full                                                        +---------+---------------+---------+-----------+----------+--------------+   +---------+---------------+---------+-----------+----------+--------------+ LEFT     CompressibilityPhasicitySpontaneityPropertiesThrombus Aging +---------+---------------+---------+-----------+----------+--------------+ CFV      Full           Yes      Yes                                 +---------+---------------+---------+-----------+----------+--------------+ SFJ      Full                                                        +---------+---------------+---------+-----------+----------+--------------+ FV Prox  Full                                                        +---------+---------------+---------+-----------+----------+--------------+ FV Mid   Full                                                        +---------+---------------+---------+-----------+----------+--------------+ FV DistalFull                                                        +---------+---------------+---------+-----------+----------+--------------+ PFV      Full                                                        +---------+---------------+---------+-----------+----------+--------------+  POP      Full           Yes      Yes                                 +---------+---------------+---------+-----------+----------+--------------+ PTV      Full                                                         +---------+---------------+---------+-----------+----------+--------------+ PERO     Full                                                        +---------+---------------+---------+-----------+----------+--------------+     Summary: BILATERAL: - No evidence of deep vein thrombosis seen in the lower extremities, bilaterally. - No evidence of superficial venous thrombosis in the lower extremities, bilaterally. -No evidence of popliteal cyst, bilaterally.   *See table(s) above for measurements and observations. Electronically signed by Sherald Hess MD on 07/16/2020 at 5:02:41 PM.    Final     PHYSICAL EXAM General: Appears well-developed. Psych: Affect appropriate to situation Eyes: No scleral injection HENT: No OP obstrucion Head: Normocephalic.  Cardiovascular: Normal rate and regular rhythm. Respiratory: Effort normal and breath sounds normal to anterior ascultation GI: Soft.  No distension. There is no tenderness.  Skin: WDI    Neurological Examination Mental Status: Alert, oriented, thought content appropriate.  Speech fluent without evidence of aphasia. Able to follow 3 step commands without difficulty. Cranial Nerves: II: Visual fields grossly normal,  III,IV, VI: ptosis not present, extra-ocular motions intact bilaterally, pupils equal, round, reactive to light and accommodation V,VII: smile symmetric, facial light touch sensation normal bilaterally VIII: hearing normal bilaterally IX,X: uvula rises symmetrically XI: bilateral shoulder shrug XII: midline tongue extension Motor: Right : Upper extremity   5/5    Left:     Upper extremity   5/5  Lower extremity   5/5, no drift today  Lower extremity   5/5 Tone and bulk:normal tone throughout; no atrophy noted.  Diminished fine finger movements on the right, but improving.  Orbits left over right upper extremity.  Trace weakness of right hip flexors and ankle dorsiflexors on double simultaneous testing. Sensory: WNL on  left. Right has decreased light touch on right arm/leg Deep Tendon Reflexes: 2+ and symmetric throughout Plantars: Right: downgoing   Left: downgoing Cerebellar: normal finger-to-nose, normal rapid alternating movements and normal heel-to-shin test Gait: not tested  ASSESSMENT/PLAN [name] is a 39 y.o. male with history of Bell's palsy in 2017 (resolved) high cholesterol, takes no meds. 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   Stroke:  Left subcortical stroke, given size, location and abnormal LICA web, this is likely embolic (cardioemboic vs large vessle embolic etiology from possible LICA web abnormality under investigation Code Stroke CT head No acute abnormality. ASPECTS 10.     CTA head & neck:  No large vessel occlusion or hemodynamically significant  proximal stenosis in the head or neck. 2. 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 This finding has been reported as having an increased risk of ischemic stroke, thus we will check a CUS to better evaluate for this  MRI:  2 cm curvilinear acute ischemic perforator type infarct extending from the posterior left lentiform nucleus towards the left corona radiata.   Carotid Doppler: No stenosis  2D Echo w/Bubble: EF 60%, hypokinesis of basal inferior wall. Neg bubble  LE [place]: neg  TCD: neg bubble  LDL 135  HgbA1c 5.3  VTE prophylaxis - lovenox    Diet   Diet Heart Room service appropriate? Yes; Fluid consistency: Thin    none prior to admission, now on ASA 81mg  + Plavix 75mg  PO daily x3 weeks, then ASA monotherapy  Therapy recommendations:  Home w/HH  Disposition:  pending  Hypertension  Home meds:  none  Stable . Permissive hypertension (OK if < 220/120) but gradually normalize in 5-7 days . Long-term BP goal normotensive  Hyperlipidemia  Home meds:  none,    LDL 135, goal < 70  Lipitor 40mg  PO daily started  Continue statin at discharge  NO Diabetes   HgbA1c 5.3, goal < 7.0  CBGs Recent Labs    07/15/20 2043  GLUCAP 89      SSI  Other Stroke Risk Factors  None  Other:  Low PLT: 103, will need to continue to monitor  F/U on antiphospholipid antibodies, ANA. (SED is wnl)  TEE scheduled for today  I will provide printed info and pictures of the carotid web for pt/family better understanding.  Long discussion with patient and his brother at the bedside and answered questions. Discussed with Dr. vascular surgeon at bedside as well  Hospital day # 1  [facility] Metzger-Cihelka, ARNP-C, ANVP-BC Pager: [phone]  07/17/2020 10:10 AM  To contact Stroke Continuity provider, please refer to [facility] ([facility] After hours, contact General Neurology

## 2020-07-17 NOTE — Progress Notes (Signed)
STROKE TEAM PROGRESS NOTE   INTERVAL HISTORY His brother is at the bedside.  Lower extremity venous Dopplers were negative.  2D echo showed some hypokinesis of basal inferior wall but ejection fraction of 60 to 65%.  There is mild aortic valve regurgitation and sclerosis but no stenosis.  TEE was done later this morning which shows just a few bubbles but no clinically visible PFO.  TCD bubble study done yesterday also had shown only 1 or 2 bubbles but was read as being negative. Patient has talked to vascular surgeon Dr. Chestine Spore but appears reluctant to consider carotid surgery for carotid web and would like to wait and think about it.  He is also had some family and medical care done in the [place]/ [place] area and is prefer to get it done at [facility] later if he chooses to. Vitals:   07/17/20 1240 07/17/20 1438 07/17/20 1449 07/17/20 1458  BP: 124/86 94/61 102/68 108/79  Pulse: 71 66 60 (!) 55  Resp: 18 (!) 22 19 19   Temp: 97.9 F (36.6 C)     TempSrc: Oral     SpO2: 98% 97%  96%  Weight: 74.4 kg     Height: 5\' 2"  (1.575 m)      CBC:  Recent Labs  Lab 07/15/20 2004 07/15/20 2015  WBC 7.6  --   NEUTROABS 4.8  --   HGB 16.2 16.7  HCT 49.5 49.0  MCV 85.9  --   PLT 103*  --    Basic Metabolic Panel:  Recent Labs  Lab 07/15/20 2004 07/15/20 2015  NA 138 139  K 3.9 4.1  CL 103 103  CO2 26  --   GLUCOSE 96 91  BUN 10 11  CREATININE 0.83 0.90  CALCIUM 9.7  --   Lipid Panel:  Recent Labs  Lab 07/16/20 0555  CHOL 224*  TRIG 262*  HDL 37*  CHOLHDL 6.1  VLDL 52*  LDLCALC 135*   HgbA1c:  Recent Labs  Lab 07/16/20 0555  HGBA1C 5.3   Urine Drug Screen:  Recent Labs  Lab 07/16/20 0729  LABOPIA NONE DETECTED  COCAINSCRNUR NONE DETECTED  LABBENZ NONE DETECTED  AMPHETMU NONE DETECTED  THCU NONE DETECTED  LABBARB NONE DETECTED    Alcohol Level No results for input(s): ETH in the last 168 hours.  IMAGING past 24 hours VAS 14/09/21 TRANSCRANIAL DOPPLER W BUBBLES  Result Date:  07/17/2020  Transcranial Doppler Indications: Stroke. Performing Technologist: [place] RVS  Examination Guidelines: A complete evaluation includes B-mode imaging, spectral Doppler, color Doppler, and power Doppler as needed of all accessible portions of each vessel. Bilateral testing is considered an integral part of a complete examination. Limited examinations for reoccurring indications may be performed as noted.  Summary: No HITS at rest or during Valsalva. Negative transcranial Doppler Bubble study with no evidence of right to left intracardiac communication.  A vascular evaluation was performed. The right middle cerebral artery was studied. An IV was inserted into the patient's right forearm . Verbal informed consent was obtained.  Negative TCD Bubble study *See table(s) above for TCD measurements and observations.  Diagnosing physician: 14/05/2020 MD Electronically signed by Blanch Media MD on 07/17/2020 at 12:57:04 PM.    Final     PHYSICAL EXAM General: Appears well-developed. Psych: Affect appropriate to situation Eyes: No scleral injection HENT: No OP obstrucion Head: Normocephalic.  Cardiovascular: Normal rate and regular rhythm. Respiratory: Effort normal and breath sounds normal to anterior ascultation GI: Soft.  No distension. There is no tenderness.  Skin: WDI    Neurological Examination Mental Status: Alert, oriented, thought content appropriate.  Speech fluent without evidence of aphasia. Able to follow 3 step commands without difficulty. Cranial Nerves: II: Visual fields grossly normal,  III,IV, VI: ptosis not present, extra-ocular motions intact bilaterally, pupils equal, round, reactive to light and accommodation V,VII: smile symmetric, facial light touch sensation normal bilaterally VIII: hearing normal bilaterally IX,X: uvula rises symmetrically XI: bilateral shoulder shrug XII: midline tongue extension Motor: Right : Upper extremity   5/5    Left:     Upper  extremity   5/5  Lower extremity   5/5, slight drift   Lower extremity   5/5 Tone and bulk:normal tone throughout; no atrophy noted.  Diminished fine finger movements on the right.  Orbits left over right upper extremity.  Trace weakness of right hip flexors and ankle dorsiflexors on double simultaneous testing. Sensory: WNL on left. Right has decreased light touch on right arm/leg Deep Tendon Reflexes: 2+ and symmetric throughout Plantars: Right: downgoing   Left: downgoing Cerebellar: normal finger-to-nose, normal rapid alternating movements and normal heel-to-shin test Gait: not tested  ASSESSMENT/PLAN [name] is a 39 y.o. male with history of Bell's palsy in 2017 (resolved) high cholesterol, takes no meds. 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   Stroke:  Left subcortical stroke, given size, location and abnormal LICA web, this is likely embolic (cardioemboic vs large vessle embolic etiology from possible LICA web abnormality under investigation Code Stroke CT head No acute abnormality. ASPECTS 10.     CTA head & neck:  No large vessel occlusion or hemodynamically significant proximal stenosis in the head or neck. 2. 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 This finding has been reported as having an increased risk of ischemic stroke, thus we will check a CUS to better evaluate for this  MRI:  2 cm curvilinear acute ischemic perforator type infarct extending from the posterior left lentiform nucleus towards the left corona radiata.   Carotid Doppler no significant bilateral extracranial stenosis.  2D Echo w/Bubble: Slight inferior wall hypokinesis.  Ejection fraction 60 to 65%.  LE [place]: Negative for DVT recent long car ride noted, but no current DVT complaints. Given young age will check Echo for PFO and LE [place].   LDL 135  HgbA1c 5.3  VTE prophylaxis - lovenox     Diet   Diet Heart Room service appropriate? Yes; Fluid consistency: Thin    none prior to admission, now on ASA 81mg  + Plavix 75mg  PO daily x3 weeks, then ASA monotherapy  Therapy recommendations:  pending  Disposition:  pending  Hypertension  Home meds:  none  Stable . Permissive hypertension (OK if < 220/120) but gradually normalize in 5-7 days . Long-term BP goal normotensive  Hyperlipidemia  Home meds:  none,   LDL 135, goal < 70  Lipitor 40mg  PO daily started  Continue statin at discharge  NO Diabetes   HgbA1c 5.3, goal < 7.0  CBGs Recent Labs    07/15/20 2043  GLUCAP 89      SSI  Other Stroke Risk Factors  None  Other Active Problems  Low PLT: 103, will need to continue to monitor  Hospital day # 1   Patient presented with right hemiparesis secondary to large left basal ganglia infarct likely of cryptogenic etiology.  CT  angiogram shows left proximal ICA web which is not flow-limiting or associated with any clot.  This has been associated with increased stroke risk but definitely therapy with surgery will have to wait till other etiologies have been ruled out.  TEE shows a very trivial PFO unlikely to be of clinical significance and lower extremity venous Dopplers are negative for DVT.  Await antiphospholipid antibody results.  Recommend aspirin 81 mg and Plavix for 3 weeks followed by aspirin alone and statin for his elevated lipids.  Long discussion with patient and his cousin at the bedside and answered questions.  Discussed with Dr. Irene Limbo Greater than 50% time during this 25-minute visit was spent on counseling and coordination of care about his cryptogenic stroke and discussion about evaluation treatment plan and answering questions.  Follow-up as an outpatient stroke clinic in 6 weeks.  Stroke team will sign off.  Kindly call for questions.   Delia Heady, MD Medical Director [facility] Pager: [phone] 07/17/2020 3:21  PM  To contact Stroke Continuity provider, please refer to [email or web address]. After hours, contact General Neurology

## 2020-07-17 NOTE — Progress Notes (Signed)
Inpatient Rehab Admissions Coordinator:   Spoke to Dr. Irene Limbo.  No plans for CEA at this time.  Will have my colleague, Megan Salon, f/u with patient tomorrow once therapy has a chance to work with patient.   Estill Dooms, PT, DPT Admissions Coordinator [phone] 07/17/20  3:34 PM

## 2020-07-17 NOTE — Anesthesia Preprocedure Evaluation (Addendum)
Anesthesia Evaluation  Patient identified by MRN, date of birth, ID band Patient awake    Reviewed: Allergy & Precautions, NPO status , Patient's Chart, lab work & pertinent test results  Airway Mallampati: I  TM Distance: >3 FB Neck ROM: Full    Dental  (+) Teeth Intact, Dental Advisory Given   Pulmonary Current Smoker,    breath sounds clear to auscultation       Cardiovascular negative cardio ROS   Rhythm:Regular Rate:Normal     Neuro/Psych  Neuromuscular disease CVA negative psych ROS   GI/Hepatic negative GI ROS, Neg liver ROS,   Endo/Other  negative endocrine ROS  Renal/GU negative Renal ROS     Musculoskeletal negative musculoskeletal ROS (+)   Abdominal Normal abdominal exam  (+)   Peds  Hematology negative hematology ROS (+)   Anesthesia Other Findings   Reproductive/Obstetrics                            Anesthesia Physical Anesthesia Plan  ASA: II  Anesthesia Plan: MAC   Post-op Pain Management:    Induction: Intravenous  PONV Risk Score and Plan: 0 and Propofol infusion  Airway Management Planned: Natural Airway and Nasal Cannula  Additional Equipment: None  Intra-op Plan:   Post-operative Plan:   Informed Consent: I have reviewed the patients History and Physical, chart, labs and discussed the procedure including the risks, benefits and alternatives for the proposed anesthesia with the patient or authorized representative who has indicated his/her understanding and acceptance.       Plan Discussed with: CRNA  Anesthesia Plan Comments: (Postponed until Monday, 07/20/20  Echo: 1. HYpokinesis of the basal inferior wall. . Left ventricular ejection  fraction, by estimation, is 60 to 65%. The left ventricle has normal  functionLeft ventricular diastolic parameters are indeterminate.  2. Right ventricular systolic function is normal. The right ventricular   size is normal.  3. The mitral valve is normal in structure. No evidence of mitral valve  regurgitation.  4. The aortic valve is abnormal. Aortic valve regurgitation is mild. Mild  aortic valve sclerosis is present, with no evidence of aortic valve  stenosis.  5. The inferior vena cava is dilated in size with <50% respiratory  variability, suggesting right atrial pressure of 15 mmHg. )      Anesthesia Quick Evaluation

## 2020-07-17 NOTE — Progress Notes (Signed)
Echocardiogram Echocardiogram Transesophageal has been performed.  Frank Cummings Frank Cummings 07/17/2020, 2:49 PM

## 2020-07-17 NOTE — Progress Notes (Addendum)
  Progress Note    07/17/2020 7:46 AM Hospital Day 1  Subjective:  Says he feels like his symptoms have gotten a little better.  Pt says surgery is very scary for him.    Vitals:   07/17/20 0131 07/17/20 0437  BP: 120/90 114/70  Pulse: 74 61  Resp: 12 14  Temp: 97.9 F (36.6 C) 97.6 F (36.4 C)  SpO2: 97% 97%    Physical Exam: General:  Sleeping soundly; awakes easily to voice Lungs:  Non labored Neuro:  Bilateral hand grips equal bilaterally; BLE strength appears equal.    CBC    Component Value Date/Time   WBC 7.6 07/15/2020 2004   RBC 5.76 07/15/2020 2004   HGB 16.7 07/15/2020 2015   HCT 49.0 07/15/2020 2015   PLT 103 (L) 07/15/2020 2004   MCV 85.9 07/15/2020 2004   MCH 28.1 07/15/2020 2004   MCHC 32.7 07/15/2020 2004   RDW 13.2 07/15/2020 2004   LYMPHSABS 2.1 07/15/2020 2004   MONOABS 0.4 07/15/2020 2004   EOSABS 0.2 07/15/2020 2004   BASOSABS 0.1 07/15/2020 2004    BMET    Component Value Date/Time   NA 139 07/15/2020 2015   K 4.1 07/15/2020 2015   CL 103 07/15/2020 2015   CO2 26 07/15/2020 2004   GLUCOSE 91 07/15/2020 2015   BUN 11 07/15/2020 2015   CREATININE 0.90 07/15/2020 2015   CALCIUM 9.7 07/15/2020 2004   GFRNONAA >60 07/15/2020 2004    INR    Component Value Date/Time   INR 1.0 07/15/2020 2004     Intake/Output Summary (Last 24 hours) at 07/17/2020 0746 Last data filed at 07/16/2020 2200 Gross per 24 hour  Intake 240 ml  Output 700 ml  Net -460 ml     Assessment/Plan:  39 y.o. male who present with acute ischemic stroke found to have web of left ICA Hospital Day 1  -Pt doing well this morning and feel his symptoms have improved without any new symptoms.   -pt most likely will require left CEA, however, pt and his family member are under the understanding that he will not require any surgery.  I discussed with pt and family member that they are going to finish their workup for the etiology of his stroke.  Neuro and Vascular MD  to speak with pt later today to clarify timing of surgery.    Doreatha Massed, PA-C [facility] [phone] 07/17/2020 7:46 AM  I have seen and evaluated the patient. I agree with the PA note as documented above.  39 year old male with acute left brain stroke likely from left ICA proximal carotid web.  I think he would likely benefit from left carotid endarterectomy.  I have spoken with Dr. Pearlean Brownie who wants him to complete his stroke work-up which is understandable.  He has a TEE scheduled for today.  I saw him at bedside with neurology nurse practitioner we discussed options of carotid on Monday versus outpatient follow-up and outpatient surgery.  The family would like to continue discussing options and will await further work-up results.  My partner will see him through the weekend.  Cephus Shelling, MD [facility] [place] Office: [phone]

## 2020-07-17 NOTE — H&P (View-Only) (Signed)
Inpatient Rehab Admissions Coordinator:   Consult received.  Pt off unit for TEE.  Note plans for possible CEA Monday.  Will check in with pt Monday or Tuesday pending plans for that.   Makeya Hilgert, PT, DPT Admissions Coordinator [phone] 07/17/20  2:24 PM   

## 2020-07-19 ENCOUNTER — Encounter (HOSPITAL_COMMUNITY): Payer: Self-pay | Admitting: Internal Medicine

## 2020-07-20 LAB — CARDIOLIPIN ANTIBODIES, IGG, IGM, IGA
Anticardiolipin IgA: <9 APL U/mL (ref 0–11)
Anticardiolipin IgG: <9 GPL U/mL (ref 0–14)
Anticardiolipin IgM: <9 MPL U/mL (ref 0–12)

## 2020-07-20 SURGERY — ENDARTERECTOMY, CAROTID
Anesthesia: General | Laterality: Left

## 2020-07-21 ENCOUNTER — Other Ambulatory Visit: Payer: Self-pay | Admitting: *Deleted

## 2020-07-21 ENCOUNTER — Encounter: Payer: Self-pay | Admitting: *Deleted

## 2020-07-21 NOTE — Patient Outreach (Signed)
[facility]) Care Management  07/21/2020  Kelen Laura Jul 22, 1981 [id number]   RED ON EMMI ALERT - Stroke Day # 1 Date: 12/13 Red Alert Reason: Have not scheduled follow up   Outreach attempt #1, successful.  Identity verified.  This care manager introduced self and stated purpose of call.  [facility] care management services explained.    Member report he is doing "much better" stating his weakness is getting better, increasing mobility.  Lives with his wife and children who help with needs around the home, also has brother that is available.  He was able to schedule follow up with PCP for tomorrow, haven't had a chance to schedule appointments with vascular and neurology.  Conference calls were placed to help member secure visits, appointments made for 12/21 with vascular and 1/18 with neurology.  Per chart, carotid endarterectomy was encouraged during hospitalization but member declined.  He is now open to conversation regarding intervention.  He is aware that outpatient rehab has been recommended, will await call to schedule.  Verbalizes understanding of recurrent stroke, denies any urgent concerns.    Plan: RN CM will follow up with member within the next 2 weeks.  Kemper Durie, [place], MSN [facility] Care Management  [facility] Manager [phone]

## 2020-07-22 ENCOUNTER — Other Ambulatory Visit: Payer: Self-pay | Admitting: *Deleted

## 2020-07-22 NOTE — Patient Outreach (Signed)
[facility]) Care Management  07/22/2020  Dillan Candela 1981/06/25 [id number]   Incoming call received from inpatient provider Dr. Irene Limbo concerning referral for outpatient PT/OT.  Referral was not placed prior to discharge, requesting assistance.  Call placed to PCP office as member reported having follow up appointment today.  Voice message left for referral coordinator requesting orders to be placed.  Will follow up with office within the next week to confirm referral was sent.  Kemper Durie, [place], MSN [facility] Care Management  [facility] Manager [phone]

## 2020-07-28 ENCOUNTER — Other Ambulatory Visit: Payer: Self-pay | Admitting: *Deleted

## 2020-07-28 ENCOUNTER — Other Ambulatory Visit: Payer: Self-pay | Admitting: Family Medicine

## 2020-07-28 ENCOUNTER — Other Ambulatory Visit: Payer: Self-pay

## 2020-07-28 ENCOUNTER — Encounter: Payer: Self-pay | Admitting: Vascular Surgery

## 2020-07-28 ENCOUNTER — Ambulatory Visit (INDEPENDENT_AMBULATORY_CARE_PROVIDER_SITE_OTHER): Payer: 59 | Admitting: Vascular Surgery

## 2020-07-28 ENCOUNTER — Encounter (HOSPITAL_COMMUNITY): Payer: Self-pay | Admitting: Vascular Surgery

## 2020-07-28 ENCOUNTER — Encounter: Payer: Self-pay | Admitting: *Deleted

## 2020-07-28 VITALS — BP 121/83 | HR 104 | Temp 98.2°F | Resp 16 | Ht 62.0 in | Wt 162.0 lb

## 2020-07-28 DIAGNOSIS — I6389 Other cerebral infarction: Secondary | ICD-10-CM

## 2020-07-28 DIAGNOSIS — I779 Disorder of arteries and arterioles, unspecified: Secondary | ICD-10-CM

## 2020-07-28 DIAGNOSIS — R7989 Other specified abnormal findings of blood chemistry: Secondary | ICD-10-CM

## 2020-07-28 NOTE — Patient Outreach (Signed)
[facility]) Care Management  07/28/2020  Ermon Sagan [birth year] [id number]   Outgoing call placed to member to inquire about contact for PT/OT services.  State he was told by his PCP that referral was placed but he is unsure where the referral was sent.  Call placed to PCP office to clarify where orders were sent, message left on referral line, will await call back and follow up within the next week.    Kemper Durie, [place], MSN [facility] Care Management  [facility] Manager [phone]

## 2020-07-28 NOTE — Progress Notes (Signed)
Spoke with pt for pre-op call. Pt denies cardiac history, HTN or Diabetes. He has recently had a stroke, no lasting weakness at this time. Pt was instructed to stay on his Aspirin and Plavix per Dr. Chestine Spore.  Pt will need Covid test on arrival.

## 2020-07-28 NOTE — Progress Notes (Signed)
Patient name: Frank Cummings MRN: [id number] DOB: [birth year] Sex: male  REASON FOR CONSULT: Hospital follow-up to discuss left carotid web and left carotid endarterectomy  HPI: Frank Cummings is a 39 y.o. male, with history of hyperlipidemia that presents for hospital follow-up to discuss left carotid intervention.  He was seen in the hospital after he presented with right hand weakness and imaging revealed acute ischemic stroke on the left side of his brain in the basal ganglia.  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  We recommended left carotid endarterectomy in the hospital but patient wanted to go home and think about this.  He had had no additional neurologic events since discharge.  He states the numbness in his right hand has resolved.  He is on dual antiplatelet therapy with aspirin Plavix.  Past Medical History:  Diagnosis Date  . Acute ischemic stroke (HCC) 07/15/2020  . Bell's palsy   . Elevated ALT measurement 07/17/2020  . High cholesterol   . Hypertriglyceridemia 07/16/2020  . Thrombocytopenia (HCC) 07/17/2020    Past Surgical History:  Procedure Laterality Date  . TEE WITHOUT CARDIOVERSION N/A 07/17/2020   Procedure: TRANSESOPHAGEAL ECHOCARDIOGRAM (TEE);  Surgeon: Dolores Patty, MD;  [facility] ENDOSCOPY;  Service: Cardiovascular;  Laterality: N/A;    Family History  Problem Relation Age of Onset  . Cancer Mother   . Hypertension Father     SOCIAL HISTORY: Social History   Socioeconomic History  . Marital status: Unknown    Spouse name: Not on file  . Number of children: Not on file  . Years of education: Not on file  . Highest education level: Not on file  Occupational History  . Not on file  Tobacco Use  . Smoking status: Current Every Day Smoker  . Smokeless tobacco: Never Used  Substance and Sexual Activity  . Alcohol use: Yes  . Drug use: Never  . Sexual activity: Not Currently  Other Topics Concern  . Not on file  Social History  Narrative  . Not on file   Social Determinants of Health   Financial Resource Strain: Not on file  Food Insecurity: Not on file  Transportation Needs: Not on file  Physical Activity: Not on file  Stress: Not on file  Social Connections: Not on file  Intimate Partner Violence: Not on file    No Known Allergies  Current Outpatient Medications  Medication Sig Dispense Refill  . acetaminophen (TYLENOL) 325 MG tablet Take 325 mg by mouth every 6 (six) hours as needed for mild pain.    Marland Kitchen aspirin EC 81 MG EC tablet Take 1 tablet (81 mg total) by mouth daily. Swallow whole.    Marland Kitchen atorvastatin (LIPITOR) 40 MG tablet Take 1 tablet (40 mg total) by mouth daily. 30 tablet 2  . clopidogrel (PLAVIX) 75 MG tablet Take 1 tablet (75 mg total) by mouth daily. Take for 3 weeks only, then stop. 21 tablet 0   No current facility-administered medications for this visit.    REVIEW OF SYSTEMS:  [X]  denotes positive finding, [ ]  denotes negative finding Cardiac  Comments:  Chest pain or chest pressure:    Shortness of breath upon exertion:    Short of breath when lying flat:    Irregular heart rhythm:        Vascular    Pain in calf, thigh, or hip brought on by ambulation:    Pain in feet at night that wakes you up from your  sleep:     Blood clot in your veins:    Leg swelling:         Pulmonary    Oxygen at home:    Productive cough:     Wheezing:         Neurologic    Sudden weakness in arms or legs:     Sudden numbness in arms or legs:     Sudden onset of difficulty speaking or slurred speech:    Temporary loss of vision in one eye:     Problems with dizziness:         Gastrointestinal    Blood in stool:     Vomited blood:         Genitourinary    Burning when urinating:     Blood in urine:        Psychiatric    Major depression:         Hematologic    Bleeding problems:    Problems with blood clotting too easily:        Skin    Rashes or ulcers:        Constitutional     Fever or chills:      PHYSICAL EXAM: Vitals:   07/28/20 1551 07/28/20 1552  BP: 115/75 121/83  Pulse: 94 (!) 104  Resp: 16   Temp: 98.2 F (36.8 C)   TempSrc: Temporal   SpO2: 98%   Weight: 162 lb (73.5 kg)   Height: 5\' 2"  (1.575 m)     GENERAL: The patient is a well-nourished male, in no acute distress. The vital signs are documented above. CARDIAC: There is a regular rate and rhythm.  PULMONARY: There is good air exchange bilaterally without wheezing or rales. ABDOMEN: Soft and non-tender with normal pitched bowel sounds.  MUSCULOSKELETAL: There are no major deformities or cyanosis. NEUROLOGIC: No focal weakness or paresthesias are detected.  Nerves II through XII grossly intact SKIN: There are no ulcers or rashes noted. PSYCHIATRIC: The patient has a normal affect.  DATA:   I again reviewed his CTA neck from hospital admission and it shows a likely carotid web with possibly some thrombus noted on sagittal imaging in the proximal left ICA.  This is a high bifurcation.  Assessment/Plan:  39 year old male who presented to the hospital with left basal ganglia infarct and further work-up revealed a left proximal ICA web.  No other obvious source was identified on stroke work-up.  This was not obviously not a flow-limiting stenosis but discussed with the patient and his family carotid webs are associated with exceedingly high risk for recurrent stroke.  I previously recommended a left carotid enterectomy in the hospital and had reviewed his imaging with several my partners who agreed.  I still feel strongly he would benefit from carotid revascularization with endarterectomy.  He wishes to proceed now that he has had further time to discuss with his family.  We will try and add him onto the surgery schedule tomorrow.  Discussed that he continue aspirin Plavix.  Discussed risk of cranial nerve injury, bleeding, infection, 1% risk of perioperative stroke, risk of anesthesia,  etc.   20, MD [facility] Office: [phone]

## 2020-07-29 ENCOUNTER — Other Ambulatory Visit: Payer: Self-pay

## 2020-07-29 ENCOUNTER — Encounter (HOSPITAL_COMMUNITY): Payer: Self-pay | Admitting: Vascular Surgery

## 2020-07-29 ENCOUNTER — Inpatient Hospital Stay (HOSPITAL_COMMUNITY): Payer: 59 | Admitting: Certified Registered Nurse Anesthetist

## 2020-07-29 ENCOUNTER — Inpatient Hospital Stay (HOSPITAL_COMMUNITY)
Admission: RE | Admit: 2020-07-29 | Discharge: 2020-08-01 | DRG: 037 | Disposition: A | Discharge status: Home / Self Care | Payer: 59 | Attending: Vascular Surgery | Admitting: Vascular Surgery

## 2020-07-29 ENCOUNTER — Encounter (HOSPITAL_COMMUNITY)
Admission: RE | Disposition: A | Discharge status: Home / Self Care | Payer: Self-pay | Source: Home / Self Care | Attending: Vascular Surgery

## 2020-07-29 DIAGNOSIS — E785 Hyperlipidemia, unspecified: Secondary | ICD-10-CM | POA: Diagnosis present

## 2020-07-29 DIAGNOSIS — I639 Cerebral infarction, unspecified: Secondary | ICD-10-CM | POA: Diagnosis present

## 2020-07-29 DIAGNOSIS — Z8249 Family history of ischemic heart disease and other diseases of the circulatory system: Secondary | ICD-10-CM | POA: Diagnosis not present

## 2020-07-29 DIAGNOSIS — Z7902 Long term (current) use of antithrombotics/antiplatelets: Secondary | ICD-10-CM | POA: Diagnosis not present

## 2020-07-29 DIAGNOSIS — R531 Weakness: Secondary | ICD-10-CM | POA: Diagnosis present

## 2020-07-29 DIAGNOSIS — Z809 Family history of malignant neoplasm, unspecified: Secondary | ICD-10-CM

## 2020-07-29 DIAGNOSIS — F17200 Nicotine dependence, unspecified, uncomplicated: Secondary | ICD-10-CM | POA: Diagnosis present

## 2020-07-29 DIAGNOSIS — Z7982 Long term (current) use of aspirin: Secondary | ICD-10-CM

## 2020-07-29 DIAGNOSIS — F32A Depression, unspecified: Secondary | ICD-10-CM | POA: Diagnosis present

## 2020-07-29 DIAGNOSIS — Z79899 Other long term (current) drug therapy: Secondary | ICD-10-CM

## 2020-07-29 DIAGNOSIS — S1093XA Contusion of unspecified part of neck, initial encounter: Secondary | ICD-10-CM | POA: Diagnosis present

## 2020-07-29 DIAGNOSIS — I6522 Occlusion and stenosis of left carotid artery: Principal | ICD-10-CM | POA: Diagnosis present

## 2020-07-29 DIAGNOSIS — E7800 Pure hypercholesterolemia, unspecified: Secondary | ICD-10-CM | POA: Diagnosis present

## 2020-07-29 DIAGNOSIS — Z20822 Contact with and (suspected) exposure to covid-19: Secondary | ICD-10-CM | POA: Diagnosis present

## 2020-07-29 DIAGNOSIS — I63232 Cerebral infarction due to unspecified occlusion or stenosis of left carotid arteries: Secondary | ICD-10-CM | POA: Diagnosis present

## 2020-07-29 DIAGNOSIS — E781 Pure hyperglyceridemia: Secondary | ICD-10-CM | POA: Diagnosis present

## 2020-07-29 HISTORY — DX: Headache, unspecified: R51.9

## 2020-07-29 HISTORY — DX: Unspecified osteoarthritis, unspecified site: M19.90

## 2020-07-29 HISTORY — DX: Depression, unspecified: F32.A

## 2020-07-29 HISTORY — PX: ENDARTERECTOMY: SHX5162

## 2020-07-29 LAB — SARS CORONAVIRUS 2 BY RT PCR (HOSPITAL ORDER, PERFORMED IN ~~LOC~~ HOSPITAL LAB): SARS Coronavirus 2: NEGATIVE

## 2020-07-29 LAB — CBC
HCT: 48.6 % (ref 39.0–52.0)
Hemoglobin: 16.1 g/dL (ref 13.0–17.0)
MCH: 28.1 pg (ref 26.0–34.0)
MCHC: 33.1 g/dL (ref 30.0–36.0)
MCV: 85 fL (ref 80.0–100.0)
Platelets: 124 10*3/uL — ABNORMAL LOW (ref 150–400)
RBC: 5.72 MIL/uL (ref 4.22–5.81)
RDW: 13 % (ref 11.5–15.5)
WBC: 7.9 10*3/uL (ref 4.0–10.5)
nRBC: 0 % (ref 0.0–0.2)

## 2020-07-29 LAB — COMPREHENSIVE METABOLIC PANEL
ALT: 101 U/L — ABNORMAL HIGH (ref 0–44)
AST: 41 U/L (ref 15–41)
Albumin: 4.4 g/dL (ref 3.5–5.0)
Alkaline Phosphatase: 111 U/L (ref 38–126)
Anion gap: 13 (ref 5–15)
BUN: 10 mg/dL (ref 6–20)
CO2: 22 mmol/L (ref 22–32)
Calcium: 9.7 mg/dL (ref 8.9–10.3)
Chloride: 105 mmol/L (ref 98–111)
Creatinine, Ser: 1.01 mg/dL (ref 0.61–1.24)
GFR, Estimated: >60 mL/min (ref >60)
Glucose, Bld: 97 mg/dL (ref 70–99)
Potassium: 3.6 mmol/L (ref 3.5–5.1)
Sodium: 140 mmol/L (ref 135–145)
Total Bilirubin: 1.2 mg/dL (ref 0.3–1.2)
Total Protein: 7.3 g/dL (ref 6.5–8.1)

## 2020-07-29 LAB — TYPE AND SCREEN
ABO/RH(D): A POS
Antibody Screen: NEGATIVE

## 2020-07-29 LAB — PROTIME-INR
INR: 1 (ref 0.8–1.2)
Prothrombin Time: 13 seconds (ref 11.4–15.2)

## 2020-07-29 LAB — URINALYSIS, ROUTINE W REFLEX MICROSCOPIC
Bilirubin Urine: NEGATIVE
Glucose, UA: NEGATIVE mg/dL
Hgb urine dipstick: NEGATIVE
Ketones, ur: NEGATIVE mg/dL
Leukocytes,Ua: NEGATIVE
Nitrite: NEGATIVE
Protein, ur: NEGATIVE mg/dL
Specific Gravity, Urine: 1.023 (ref 1.005–1.030)
pH: 6 (ref 5.0–8.0)

## 2020-07-29 LAB — ABO/RH: ABO/RH(D): A POS

## 2020-07-29 LAB — APTT: aPTT: 32 seconds (ref 24–36)

## 2020-07-29 SURGERY — ENDARTERECTOMY, CAROTID
Anesthesia: General | Site: Neck | Laterality: Left

## 2020-07-29 MED ORDER — MIDAZOLAM HCL 5 MG/5ML IJ SOLN
INTRAMUSCULAR | Status: DC | PRN
  Administered 2020-07-29: 2 mg via INTRAVENOUS

## 2020-07-29 MED ORDER — SODIUM CHLORIDE 0.9 % IV SOLN: 500.0000 mL | Freq: Once | INTRAVENOUS | Status: DC | PRN

## 2020-07-29 MED ORDER — ALUM & MAG HYDROXIDE-SIMETH 200-200-20 MG/5ML PO SUSP: 15.0000 mL | ORAL | Status: DC | PRN

## 2020-07-29 MED ORDER — ACETAMINOPHEN 10 MG/ML IV SOLN
INTRAVENOUS | Status: AC
  Filled 2020-07-29: qty 100

## 2020-07-29 MED ORDER — CHLORHEXIDINE GLUCONATE 0.12 % MT SOLN
15.0000 mL | Freq: Once | OROMUCOSAL | Status: AC
  Filled 2020-07-29: qty 15

## 2020-07-29 MED ORDER — SODIUM CHLORIDE 0.9 % IV SOLN: INTRAVENOUS | Status: DC

## 2020-07-29 MED ORDER — SODIUM CHLORIDE 0.9 % IV SOLN
INTRAVENOUS | Status: AC
  Filled 2020-07-29: qty 1.2

## 2020-07-29 MED ORDER — HYDROMORPHONE HCL 1 MG/ML IJ SOLN: 0.5000 mg | INTRAMUSCULAR | Status: DC | PRN

## 2020-07-29 MED ORDER — ACETAMINOPHEN 325 MG PO TABS
325.0000 mg | ORAL_TABLET | ORAL | Status: DC | PRN
  Administered 2020-07-30 (×2): 650 mg via ORAL
  Filled 2020-07-29 (×2): qty 2

## 2020-07-29 MED ORDER — HYDROMORPHONE HCL 1 MG/ML IJ SOLN
0.2500 mg | INTRAMUSCULAR | Status: DC | PRN
  Administered 2020-07-29: 0.5 mg via INTRAVENOUS

## 2020-07-29 MED ORDER — ONDANSETRON HCL 4 MG/2ML IJ SOLN
INTRAMUSCULAR | Status: AC
  Filled 2020-07-29: qty 2

## 2020-07-29 MED ORDER — OXYCODONE-ACETAMINOPHEN 5-325 MG PO TABS
1.0000 | ORAL_TABLET | ORAL | Status: DC | PRN
  Administered 2020-07-30 – 2020-07-31 (×3): 2 via ORAL
  Filled 2020-07-29 (×3): qty 2

## 2020-07-29 MED ORDER — CHLORHEXIDINE GLUCONATE CLOTH 2 % EX PADS: 6.0000 | MEDICATED_PAD | Freq: Once | CUTANEOUS | Status: DC

## 2020-07-29 MED ORDER — PHENYLEPHRINE HCL (PRESSORS) 10 MG/ML IV SOLN
INTRAVENOUS | Status: AC
  Filled 2020-07-29: qty 1

## 2020-07-29 MED ORDER — PROTAMINE SULFATE 10 MG/ML IV SOLN
INTRAVENOUS | Status: AC
  Filled 2020-07-29: qty 10

## 2020-07-29 MED ORDER — SODIUM CHLORIDE 0.9 % IV SOLN: INTRAVENOUS | Status: DC | PRN

## 2020-07-29 MED ORDER — LIDOCAINE 2% (20 MG/ML) 5 ML SYRINGE
INTRAMUSCULAR | Status: DC | PRN
  Administered 2020-07-29: 60 mg via INTRAVENOUS

## 2020-07-29 MED ORDER — PHENOL 1.4 % MT LIQD
1.0000 | OROMUCOSAL | Status: DC | PRN
  Filled 2020-07-29: qty 177

## 2020-07-29 MED ORDER — PROPOFOL 10 MG/ML IV BOLUS
INTRAVENOUS | Status: DC | PRN
  Administered 2020-07-29: 150 mg via INTRAVENOUS

## 2020-07-29 MED ORDER — CEFAZOLIN SODIUM-DEXTROSE 2-4 GM/100ML-% IV SOLN
2.0000 g | INTRAVENOUS | Status: AC
  Administered 2020-07-29: 13:00:00 2 g via INTRAVENOUS
  Filled 2020-07-29: qty 100

## 2020-07-29 MED ORDER — FENTANYL CITRATE (PF) 250 MCG/5ML IJ SOLN
INTRAMUSCULAR | Status: AC
  Filled 2020-07-29: qty 5

## 2020-07-29 MED ORDER — EPHEDRINE 5 MG/ML INJ
INTRAVENOUS | Status: AC
  Filled 2020-07-29: qty 10

## 2020-07-29 MED ORDER — PHENYLEPHRINE 40 MCG/ML (10ML) SYRINGE FOR IV PUSH (FOR BLOOD PRESSURE SUPPORT)
PREFILLED_SYRINGE | INTRAVENOUS | Status: AC
  Filled 2020-07-29: qty 10

## 2020-07-29 MED ORDER — CHLORHEXIDINE GLUCONATE 0.12 % MT SOLN
OROMUCOSAL | Status: AC
  Administered 2020-07-29: 11:00:00 15 mL via OROMUCOSAL
  Filled 2020-07-29: qty 15

## 2020-07-29 MED ORDER — CEFAZOLIN SODIUM-DEXTROSE 2-4 GM/100ML-% IV SOLN
2.0000 g | Freq: Three times a day (TID) | INTRAVENOUS | Status: AC
  Administered 2020-07-29 – 2020-07-30 (×2): 2 g via INTRAVENOUS
  Filled 2020-07-29 (×2): qty 100

## 2020-07-29 MED ORDER — ALBUMIN HUMAN 5 % IV SOLN: INTRAVENOUS | Status: DC | PRN

## 2020-07-29 MED ORDER — METOPROLOL TARTRATE 5 MG/5ML IV SOLN: 2.0000 mg | INTRAVENOUS | Status: DC | PRN

## 2020-07-29 MED ORDER — SUGAMMADEX SODIUM 200 MG/2ML IV SOLN
INTRAVENOUS | Status: DC | PRN
  Administered 2020-07-29: 200 mg via INTRAVENOUS

## 2020-07-29 MED ORDER — LIDOCAINE 2% (20 MG/ML) 5 ML SYRINGE
INTRAMUSCULAR | Status: AC
  Filled 2020-07-29: qty 10

## 2020-07-29 MED ORDER — PROPOFOL 10 MG/ML IV BOLUS
INTRAVENOUS | Status: AC
  Filled 2020-07-29: qty 20

## 2020-07-29 MED ORDER — LABETALOL HCL 5 MG/ML IV SOLN: 10.0000 mg | INTRAVENOUS | Status: DC | PRN

## 2020-07-29 MED ORDER — ONDANSETRON HCL 4 MG/2ML IJ SOLN: 4.0000 mg | Freq: Once | INTRAMUSCULAR | Status: DC | PRN

## 2020-07-29 MED ORDER — HEPARIN SODIUM (PORCINE) 5000 UNIT/ML IJ SOLN
5000.0000 [IU] | Freq: Three times a day (TID) | INTRAMUSCULAR | Status: DC
  Administered 2020-07-30 – 2020-08-01 (×7): 5000 [IU] via SUBCUTANEOUS
  Filled 2020-07-29 (×7): qty 1

## 2020-07-29 MED ORDER — HYDROMORPHONE HCL 1 MG/ML IJ SOLN: 0.2500 mg | INTRAMUSCULAR | Status: DC | PRN

## 2020-07-29 MED ORDER — DEXAMETHASONE SODIUM PHOSPHATE 10 MG/ML IJ SOLN
INTRAMUSCULAR | Status: DC | PRN
  Administered 2020-07-29: 4 mg via INTRAVENOUS

## 2020-07-29 MED ORDER — PANTOPRAZOLE SODIUM 40 MG PO TBEC
40.0000 mg | DELAYED_RELEASE_TABLET | Freq: Every day | ORAL | Status: DC
  Administered 2020-07-29 – 2020-08-01 (×4): 40 mg via ORAL
  Filled 2020-07-29 (×4): qty 1

## 2020-07-29 MED ORDER — DOCUSATE SODIUM 100 MG PO CAPS
100.0000 mg | ORAL_CAPSULE | Freq: Every day | ORAL | Status: DC
  Administered 2020-07-30 – 2020-08-01 (×3): 100 mg via ORAL
  Filled 2020-07-29 (×3): qty 1

## 2020-07-29 MED ORDER — ACETAMINOPHEN 10 MG/ML IV SOLN
1000.0000 mg | Freq: Once | INTRAVENOUS | Status: AC
  Administered 2020-07-29: 17:00:00 1000 mg via INTRAVENOUS

## 2020-07-29 MED ORDER — FENTANYL CITRATE (PF) 250 MCG/5ML IJ SOLN
INTRAMUSCULAR | Status: DC | PRN
  Administered 2020-07-29: 50 ug via INTRAVENOUS
  Administered 2020-07-29: 100 ug via INTRAVENOUS

## 2020-07-29 MED ORDER — MIDAZOLAM HCL 2 MG/2ML IJ SOLN
INTRAMUSCULAR | Status: AC
  Filled 2020-07-29: qty 2

## 2020-07-29 MED ORDER — GUAIFENESIN-DM 100-10 MG/5ML PO SYRP: 15.0000 mL | ORAL_SOLUTION | ORAL | Status: DC | PRN

## 2020-07-29 MED ORDER — HYDROMORPHONE HCL 1 MG/ML IJ SOLN
INTRAMUSCULAR | Status: AC
  Filled 2020-07-29: qty 1

## 2020-07-29 MED ORDER — EPHEDRINE SULFATE-NACL 50-0.9 MG/10ML-% IV SOSY
PREFILLED_SYRINGE | INTRAVENOUS | Status: DC | PRN
  Administered 2020-07-29: 5 mg via INTRAVENOUS
  Administered 2020-07-29: 2.5 mg via INTRAVENOUS
  Administered 2020-07-29 (×2): 5 mg via INTRAVENOUS

## 2020-07-29 MED ORDER — ATORVASTATIN CALCIUM 40 MG PO TABS
40.0000 mg | ORAL_TABLET | Freq: Every day | ORAL | Status: DC
  Administered 2020-07-29 – 2020-08-01 (×4): 40 mg via ORAL
  Filled 2020-07-29 (×4): qty 1

## 2020-07-29 MED ORDER — ACETAMINOPHEN 10 MG/ML IV SOLN: 1000.0000 mg | Freq: Once | INTRAVENOUS | Status: DC | PRN

## 2020-07-29 MED ORDER — ROCURONIUM BROMIDE 10 MG/ML (PF) SYRINGE
PREFILLED_SYRINGE | INTRAVENOUS | Status: AC
  Filled 2020-07-29: qty 30

## 2020-07-29 MED ORDER — HEPARIN SODIUM (PORCINE) 1000 UNIT/ML IJ SOLN
INTRAMUSCULAR | Status: DC | PRN
  Administered 2020-07-29: 8000 [IU] via INTRAVENOUS

## 2020-07-29 MED ORDER — ESMOLOL HCL 100 MG/10ML IV SOLN
INTRAVENOUS | Status: DC | PRN
  Administered 2020-07-29 (×2): 20 mg via INTRAVENOUS

## 2020-07-29 MED ORDER — HEPARIN SODIUM (PORCINE) 1000 UNIT/ML IJ SOLN
INTRAMUSCULAR | Status: AC
  Filled 2020-07-29: qty 1

## 2020-07-29 MED ORDER — ONDANSETRON HCL 4 MG/2ML IJ SOLN
INTRAMUSCULAR | Status: DC | PRN
  Administered 2020-07-29: 4 mg via INTRAVENOUS

## 2020-07-29 MED ORDER — 0.9 % SODIUM CHLORIDE (POUR BTL) OPTIME
TOPICAL | Status: DC | PRN
  Administered 2020-07-29 (×2): 1000 mL

## 2020-07-29 MED ORDER — ASPIRIN EC 81 MG PO TBEC
81.0000 mg | DELAYED_RELEASE_TABLET | Freq: Every day | ORAL | Status: DC
  Administered 2020-07-30 – 2020-08-01 (×3): 81 mg via ORAL
  Filled 2020-07-29 (×3): qty 1

## 2020-07-29 MED ORDER — MAGNESIUM SULFATE 2 GM/50ML IV SOLN: 2.0000 g | Freq: Every day | INTRAVENOUS | Status: DC | PRN

## 2020-07-29 MED ORDER — PHENYLEPHRINE HCL-NACL 10-0.9 MG/250ML-% IV SOLN
INTRAVENOUS | Status: DC | PRN
  Administered 2020-07-29: 25 ug/min via INTRAVENOUS

## 2020-07-29 MED ORDER — ONDANSETRON HCL 4 MG/2ML IJ SOLN
4.0000 mg | Freq: Four times a day (QID) | INTRAMUSCULAR | Status: DC | PRN
  Administered 2020-07-30 – 2020-07-31 (×2): 4 mg via INTRAVENOUS
  Filled 2020-07-29 (×2): qty 2

## 2020-07-29 MED ORDER — POTASSIUM CHLORIDE CRYS ER 20 MEQ PO TBCR: 20.0000 meq | EXTENDED_RELEASE_TABLET | Freq: Every day | ORAL | Status: DC | PRN

## 2020-07-29 MED ORDER — HYDRALAZINE HCL 20 MG/ML IJ SOLN
5.0000 mg | INTRAMUSCULAR | Status: DC | PRN
Start: 2020-07-29 — End: 2020-07-29

## 2020-07-29 MED ORDER — LACTATED RINGERS IV SOLN: INTRAVENOUS | Status: DC | PRN

## 2020-07-29 MED ORDER — ACETAMINOPHEN 325 MG RE SUPP
325.0000 mg | RECTAL | Status: DC | PRN
Start: 2020-07-29 — End: 2020-08-01
  Filled 2020-07-29: qty 2

## 2020-07-29 MED ORDER — CLOPIDOGREL BISULFATE 75 MG PO TABS
75.0000 mg | ORAL_TABLET | Freq: Every day | ORAL | Status: DC
  Administered 2020-07-30 – 2020-08-01 (×3): 75 mg via ORAL
  Filled 2020-07-29 (×4): qty 1

## 2020-07-29 MED ORDER — SODIUM CHLORIDE 0.9 % IV SOLN
0.0125 ug/kg/min | INTRAVENOUS | Status: AC
  Administered 2020-07-29: 13:00:00 .1 ug/kg/min via INTRAVENOUS
  Filled 2020-07-29: qty 2000

## 2020-07-29 MED ORDER — PROTAMINE SULFATE 10 MG/ML IV SOLN
INTRAVENOUS | Status: DC | PRN
  Administered 2020-07-29: 50 mg via INTRAVENOUS

## 2020-07-29 MED ORDER — ROCURONIUM BROMIDE 10 MG/ML (PF) SYRINGE
PREFILLED_SYRINGE | INTRAVENOUS | Status: DC | PRN
  Administered 2020-07-29: 10 mg via INTRAVENOUS
  Administered 2020-07-29: 20 mg via INTRAVENOUS
  Administered 2020-07-29: 80 mg via INTRAVENOUS

## 2020-07-29 MED ORDER — HEMOSTATIC AGENTS (NO CHARGE) OPTIME
TOPICAL | Status: DC | PRN
  Administered 2020-07-29 (×3): 1 via TOPICAL

## 2020-07-29 SURGICAL SUPPLY — 56 items
ADH SKN CLS APL DERMABOND .7 (GAUZE/BANDAGES/DRESSINGS) ×1
ADPR TBG 2 MALE LL ART (MISCELLANEOUS)
CANISTER SUCT 3000ML PPV (MISCELLANEOUS) ×2 IMPLANT
CATH ROBINSON RED A/P 18FR (CATHETERS) ×2 IMPLANT
CATH SUCT 10FR WHISTLE TIP (CATHETERS) ×2 IMPLANT
CLIP VESOCCLUDE MED 24/CT (CLIP) ×2 IMPLANT
CLIP VESOCCLUDE SM WIDE 24/CT (CLIP) ×2 IMPLANT
COVER PROBE W GEL 5X96 (DRAPES) IMPLANT
COVER TRANSDUCER ULTRASND GEL (DISPOSABLE) ×2 IMPLANT
DERMABOND ADVANCED (GAUZE/BANDAGES/DRESSINGS) ×1
DERMABOND ADVANCED .7 DNX12 (GAUZE/BANDAGES/DRESSINGS) ×1 IMPLANT
DRAIN CHANNEL 15F RND FF W/TCR (WOUND CARE) IMPLANT
ELECT REM PT RETURN 9FT ADLT (ELECTROSURGICAL) ×2
ELECTRODE REM PT RTRN 9FT ADLT (ELECTROSURGICAL) ×1 IMPLANT
EVACUATOR SILICONE 100CC (DRAIN) IMPLANT
GAUZE 4X4 16PLY RFD (DISPOSABLE) ×2 IMPLANT
GLOVE BIO SURGEON STRL SZ7.5 (GLOVE) ×2 IMPLANT
GLOVE BIOGEL PI IND STRL 8 (GLOVE) ×1 IMPLANT
GLOVE BIOGEL PI INDICATOR 8 (GLOVE) ×1
GLOVE SURG UNDER POLY LF SZ6.5 (GLOVE) ×2 IMPLANT
GLOVE SURG UNDER POLY LF SZ7 (GLOVE) ×2 IMPLANT
GOWN STRL REUS W/ TWL LRG LVL3 (GOWN DISPOSABLE) ×2 IMPLANT
GOWN STRL REUS W/ TWL XL LVL3 (GOWN DISPOSABLE) ×2 IMPLANT
GOWN STRL REUS W/TWL LRG LVL3 (GOWN DISPOSABLE) ×4
GOWN STRL REUS W/TWL XL LVL3 (GOWN DISPOSABLE) ×4
HEMOSTAT SNOW SURGICEL 2X4 (HEMOSTASIS) ×6 IMPLANT
IV ADAPTER SYR DOUBLE MALE LL (MISCELLANEOUS) IMPLANT
KIT BASIN OR (CUSTOM PROCEDURE TRAY) ×2 IMPLANT
KIT SHUNT ARGYLE CAROTID ART 6 (VASCULAR PRODUCTS) IMPLANT
KIT TURNOVER KIT B (KITS) ×2 IMPLANT
LOOP VESSEL MINI RED (MISCELLANEOUS) ×2 IMPLANT
NEEDLE HYPO 25GX1X1/2 BEV (NEEDLE) IMPLANT
NEEDLE SPNL 20GX3.5 QUINCKE YW (NEEDLE) IMPLANT
NS IRRIG 1000ML POUR BTL (IV SOLUTION) ×6 IMPLANT
PACK CAROTID (CUSTOM PROCEDURE TRAY) ×2 IMPLANT
PAD ARMBOARD 7.5X6 YLW CONV (MISCELLANEOUS) ×4 IMPLANT
PATCH VASC XENOSURE 1CMX6CM (Vascular Products) ×2 IMPLANT
PATCH VASC XENOSURE 1X6 (Vascular Products) ×1 IMPLANT
POSITIONER HEAD DONUT 9IN (MISCELLANEOUS) ×2 IMPLANT
SHUNT CAROTID BYPASS 10 (VASCULAR PRODUCTS) IMPLANT
SHUNT CAROTID BYPASS 12FRX15.5 (VASCULAR PRODUCTS) IMPLANT
SPONGE SURGIFOAM ABS GEL 100 (HEMOSTASIS) IMPLANT
STOPCOCK 4 WAY LG BORE MALE ST (IV SETS) IMPLANT
SUT ETHILON 3 0 PS 1 (SUTURE) IMPLANT
SUT MNCRL AB 4-0 PS2 18 (SUTURE) ×2 IMPLANT
SUT PROLENE 5 0 C 1 24 (SUTURE) ×2 IMPLANT
SUT PROLENE 6 0 BV (SUTURE) ×18 IMPLANT
SUT SILK 3 0 (SUTURE)
SUT SILK 3-0 18XBRD TIE 12 (SUTURE) IMPLANT
SUT VIC AB 3-0 SH 27 (SUTURE) ×4
SUT VIC AB 3-0 SH 27X BRD (SUTURE) ×2 IMPLANT
SYR BULB IRRIG 60ML STRL (SYRINGE) ×2 IMPLANT
SYR CONTROL 10ML LL (SYRINGE) IMPLANT
TOWEL GREEN STERILE (TOWEL DISPOSABLE) ×2 IMPLANT
TUBING ART PRESS 48 MALE/FEM (TUBING) IMPLANT
WATER STERILE IRR 1000ML POUR (IV SOLUTION) ×2 IMPLANT

## 2020-07-29 NOTE — Anesthesia Procedure Notes (Signed)
Procedure Name: Intubation Date/Time: 07/29/2020 12:47 PM Performed by: Waynard Edwards, CRNA Pre-anesthesia Checklist: Patient identified, Emergency Drugs available, Suction available and Patient being monitored Patient Re-evaluated:Patient Re-evaluated prior to induction Oxygen Delivery Method: Circle system utilized Preoxygenation: Pre-oxygenation with 100% oxygen Induction Type: IV induction Ventilation: Mask ventilation without difficulty Laryngoscope Size: Miller and 2 Grade View: Grade I Tube type: Oral Tube size: 7.5 mm Number of attempts: 1 Airway Equipment and Method: Stylet and Oral airway Placement Confirmation: ETT inserted through vocal cords under direct vision,  positive ETCO2 and breath sounds checked- equal and bilateral Secured at: 22 cm Tube secured with: Tape Dental Injury: Teeth and Oropharynx as per pre-operative assessment

## 2020-07-29 NOTE — H&P (Signed)
History and Physical Interval Note:  07/29/2020 12:10 PM  Frank Cummings  has presented today for surgery, with the diagnosis of carotid webbing.  The various methods of treatment have been discussed with the patient and family. After consideration of risks, benefits and other options for treatment, the patient has consented to  Procedure(s): ENDARTERECTOMY CAROTID LEFT (Left) as a surgical intervention.  The patient's history has been reviewed, patient examined, no change in status, stable for surgery.  I have reviewed the patient's chart and labs.  Questions were answered to the patient's satisfaction.    Plan left carotid endarterectomy for left carotid web.  Had left brain event and this web is likely etiology.  Cephus Shelling    Patient name: Frank Cummings    MRN: [id number]        DOB: 08-14-1980          Sex: male  REASON FOR CONSULT: Hospital follow-up to discuss left carotid web and left carotid endarterectomy  HPI: Frank Cummings is a 39 y.o. male, with history of hyperlipidemia that presents for hospital follow-up to discuss left carotid intervention.  He was seen in the hospital after he presented with right hand weakness and imaging revealed acute ischemic stroke on the left side of his brain in the basal ganglia.  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  We recommended left carotid endarterectomy in the hospital but patient wanted to go home and think about this.  He had had no additional neurologic events since discharge.  He states the numbness in his right hand has resolved.  He is on dual antiplatelet therapy with aspirin Plavix.      Past Medical History:  Diagnosis Date  . Acute ischemic stroke (HCC) 07/15/2020  . Bell's palsy   . Elevated ALT measurement 07/17/2020  . High cholesterol   . Hypertriglyceridemia 07/16/2020  . Thrombocytopenia (HCC) 07/17/2020         Past Surgical History:  Procedure Laterality Date  . TEE WITHOUT CARDIOVERSION N/A  07/17/2020   Procedure: TRANSESOPHAGEAL ECHOCARDIOGRAM (TEE);  Surgeon: Dolores Patty, MD;  Location: [facility];  Service: Cardiovascular;  Laterality: N/A;         Family History  Problem Relation Age of Onset  . Cancer Mother   . Hypertension Father     SOCIAL HISTORY: Social History        Socioeconomic History  . Marital status: Unknown    Spouse name: Not on file  . Number of children: Not on file  . Years of education: Not on file  . Highest education level: Not on file  Occupational History  . Not on file  Tobacco Use  . Smoking status: Current Every Day Smoker  . Smokeless tobacco: Never Used  Substance and Sexual Activity  . Alcohol use: Yes  . Drug use: Never  . Sexual activity: Not Currently  Other Topics Concern  . Not on file  Social History Narrative  . Not on file   Social Determinants of Health   Financial Resource Strain: Not on file  Food Insecurity: Not on file  Transportation Needs: Not on file  Physical Activity: Not on file  Stress: Not on file  Social Connections: Not on file  Intimate Partner Violence: Not on file    No Known Allergies        Current Outpatient Medications  Medication Sig Dispense Refill  . acetaminophen (TYLENOL) 325 MG tablet Take 325 mg by mouth every 6 (  six) hours as needed for mild pain.    Marland Kitchen aspirin EC 81 MG EC tablet Take 1 tablet (81 mg total) by mouth daily. Swallow whole.    Marland Kitchen atorvastatin (LIPITOR) 40 MG tablet Take 1 tablet (40 mg total) by mouth daily. 30 tablet 2  . clopidogrel (PLAVIX) 75 MG tablet Take 1 tablet (75 mg total) by mouth daily. Take for 3 weeks only, then stop. 21 tablet 0   No current facility-administered medications for this visit.    REVIEW OF SYSTEMS:  [X]  denotes positive finding, [ ]  denotes negative finding Cardiac  Comments:  Chest pain or chest pressure:    Shortness of breath upon exertion:    Short of breath when lying flat:     Irregular heart rhythm:        Vascular    Pain in calf, thigh, or hip brought on by ambulation:    Pain in feet at night that wakes you up from your sleep:     Blood clot in your veins:    Leg swelling:         Pulmonary    Oxygen at home:    Productive cough:     Wheezing:         Neurologic    Sudden weakness in arms or legs:     Sudden numbness in arms or legs:     Sudden onset of difficulty speaking or slurred speech:    Temporary loss of vision in one eye:     Problems with dizziness:         Gastrointestinal    Blood in stool:     Vomited blood:         Genitourinary    Burning when urinating:     Blood in urine:        Psychiatric    Major depression:         Hematologic    Bleeding problems:    Problems with blood clotting too easily:        Skin    Rashes or ulcers:        Constitutional    Fever or chills:      PHYSICAL EXAM:     Vitals:   07/28/20 1551 07/28/20 1552  BP: 115/75 121/83  Pulse: 94 (!) 104  Resp: 16   Temp: 98.2 F (36.8 C)   TempSrc: Temporal   SpO2: 98%   Weight: 162 lb (73.5 kg)   Height: 5\' 2"  (1.575 m)     GENERAL: The patient is a well-nourished male, in no acute distress. The vital signs are documented above. CARDIAC: There is a regular rate and rhythm.  PULMONARY: There is good air exchange bilaterally without wheezing or rales. ABDOMEN: Soft and non-tender with normal pitched bowel sounds.  MUSCULOSKELETAL: There are no major deformities or cyanosis. NEUROLOGIC: No focal weakness or paresthesias are detected.  Nerves II through XII grossly intact SKIN: There are no ulcers or rashes noted. PSYCHIATRIC: The patient has a normal affect.  DATA:   I again reviewed his CTA neck from hospital admission and it shows a likely carotid web with possibly some thrombus noted on sagittal imaging in the proximal left ICA.  This  is a high bifurcation.  Assessment/Plan:  39 year old male who presented to the hospital with left basal ganglia infarct and further work-up revealed a left proximal ICA web.  No other obvious source was identified on stroke work-up.  This was not obviously not  a flow-limiting stenosis but discussed with the patient and his family carotid webs are associated with exceedingly high risk for recurrent stroke.  I previously recommended a left carotid enterectomy in the hospital and had reviewed his imaging with several my partners who agreed.  I still feel strongly he would benefit from carotid revascularization with endarterectomy.  He wishes to proceed now that he has had further time to discuss with his family.  We will try and add him onto the surgery schedule tomorrow.  Discussed that he continue aspirin Plavix.  Discussed risk of cranial nerve injury, bleeding, infection, 1% risk of perioperative stroke, risk of anesthesia, etc.   Cephus Shelling, MD [facility] Office: [phone]

## 2020-07-29 NOTE — Anesthesia Preprocedure Evaluation (Addendum)
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 Bowel sounds: normal.  Peds  Hematology negative hematology ROS (+)   Anesthesia Other Findings   Reproductive/Obstetrics                             Anesthesia Physical Anesthesia Plan  ASA: III  Anesthesia Plan: General   Post-op Pain Management:    Induction: Intravenous  PONV Risk Score and Plan: 2 and Ondansetron, Midazolam and Treatment may vary due to age or medical condition  Airway Management Planned: Mask and Oral ETT  Additional Equipment: Arterial line  Intra-op Plan:   Post-operative Plan: Extubation in OR  Informed Consent: I have reviewed the patients History and Physical, chart, labs and discussed the procedure including the risks, benefits and alternatives for the proposed anesthesia with the patient or authorized representative who has indicated his/her understanding and acceptance.     Dental advisory given  Plan Discussed with: CRNA  Anesthesia Plan Comments: (ECHO 07/16/20: 1. HYpokinesis of the basal inferior wall. . Left ventricular ejection  fraction, by estimation, is 60 to 65%. The left ventricle has normal  functionLeft ventricular diastolic parameters are indeterminate.   2. Right ventricular systolic function is normal. The right ventricular  size is normal.  3. The mitral valve is normal in structure. No evidence of mitral valve  regurgitation.  4. The aortic valve is abnormal. Aortic valve regurgitation is mild. Mild  aortic valve sclerosis is present, with no evidence of aortic valve  stenosis.  5. The inferior vena cava is dilated in size with <50% respiratory  variability, suggesting right atrial pressure of 15 mmHg.  Lab Results      Component                Value               Date                      WBC                      7.6                 07/15/2020                HGB                      16.7                07/15/2020                HCT  49.0                07/15/2020                MCV                      85.9                07/15/2020                PLT                      103 (L)             07/15/2020           Lab Results      Component                Value               Date                      NA                       139                 07/15/2020                K                        4.1                 07/15/2020                CO2                      26                  07/15/2020                GLUCOSE                  91                  07/15/2020                BUN                      11                  07/15/2020                CREATININE               0.90                07/15/2020                CALCIUM                  9.7                 07/15/2020                GFRNONAA                 >60  07/15/2020          )       Anesthesia Quick Evaluation

## 2020-07-29 NOTE — Op Note (Signed)
OPERATIVE NOTE  PROCEDURE:   1.  left carotid endarterectomy with bovine patch angioplasty 2.  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  This was subsequently removed with endarterectomy.  The distal endpoint in the ICA was tacked down and bovine patch angioplasty was performed.  Good doppler signals and no flaps on carotid ultrasound at the completion of the case.   SPECIMEN(S):  Carotid web  INDICATIONS:   Frank Cummings is a 39 y.o. male who presented with left brain stroke and subsequent work-up revealed likely left proximal internal carotid artery web.  We subsequently recommended carotid endarterectomy and he presents today.  I discussed with the patient the risks, benefits, and alternatives to carotid endarterectomy.  The patient is aware that the risks of carotid endarterectomy include but are not limited to: bleeding, infection, stroke, myocardial infarction, death, cranial nerve injuries both temporary and permanent, neck hematoma, possible airway compromise, labile blood pressure post-operatively, cerebral hyperperfusion syndrome, and possible need for additional interventions in the future. The patient is aware of the risks and agrees to proceed forward with the procedure.  An assistant was needed for exposure and to expedite the case.  DESCRIPTION: After full informed written consent was obtained from the patient, the patient was brought back to the operating room and placed supine upon the operating table.  Prior to induction, the patient received IV antibiotics.  After obtaining adequate anesthesia, the patient was placed into semi-Fowler position with  a shoulder roll in place and the patient's neck slightly hyperextended and rotated away from the surgical site.  The patient was prepped in the standard fashion for a left carotid endarterectomy.  I made an incision anterior to the sternocleidomastoid muscle and dissected down through the subcutaneous tissue.  The platysma was opened with electrocautery.  I then used Bovie cautery and blunt dissection to dissect through the underlying platysma and to mobilize the anterior border of the sternocleidomastoid as well as the internal jugular vein laterally.  The facial vein was ligated with 3-0 silk and surgical clips and divided.  After identifying the carotid artery I used Metzenbaum scissors to bluntly dissect the common carotid artery and then controlled with a umbilical tape.  At this point in time the patient was given 100 units/kg of IV heparin and we checked an ACT to ensure it was greater than 250.  I then carried my dissection cephalad and mobilized the external carotid artery and superior thyroid artery and controlled each of these with a vessel loop.  I then dissected out the internal carotid artery well past the lesion identified on CT.  This was a high bifurcation that required mobilization of the hypoglossal nerve to get distal exposure.  The internal carotid artery was then controlled with a umbilical tape as well. I was careful to identify the vagus nerve between the internal jugular and common carotid and this was presereved.  I was also careful to identify and preserve the hypoglossal nerve and this was preserved.    Once our ACT was confirmed, I proceeded by clamping the internal carotid artery with a angled bulldog clamp first.  The proximal common carotid artery was controlled with a angled debakey clamp.  The external carotid was controlled with a vessel loop.  I subsequently opened the common carotid artery with an 11 blade scalpel in longitudinal fashion and extended the arteriotomy with Potts  scissors onto the ICA.  There was excellent backbleeding and this was a high bifurcation so we elected not to place a shunt. In the proximal ICA we visualized a web consistent with CTA scan.  I then used a Runner, broadcasting/film/video and performed a endarterectomy starting in the common carotid artery.  The external carotid artery was endarterectomized with an eversion technique and I was careful to feather the distal ICA plaque.  The specimen was passed off the field.  The endarterectomy site was then flushed with heparinized saline and I was careful to ensure there were no flaps in the endarterectomy site.  I did tack down the distal flap with multiple 6-0 Prolene.  I then brought a bovine carotid patch on the field and this was sewn in place with a running anastomosis using a 6-0 Prolene distally and a 5-0 proximal.  The bovine patch was trimmed accordingly.  The artery was flushed antegrade and retrograde prior to completion of the patch.  Once the patch was complete, I flushed up the external carotid artery first prior to releasing the internal carotid artery clamp.  An intraoperative duplex was performed that showed no evidence of any flaps.  There was excellent doppler signal in the ICA and ECA.  Once I was happy with the intraoperative ultrasound the patient was given protamine for reversal.  I used surgicel snow to get hemostasis around the patch.  Ultimately the platysma was closed in running fashion with 3-0 Vicryl.  The skin was closed with a running 4-0 Monocryl.  Dermabond was applied with a dry sterile dressing.  The patient was awakened from anesthesia with no new neurological deficit and taken to PACU in stable condition.    COMPLICATIONS: None  CONDITION: Stable  Cephus Shelling, MD [facility] Office: [phone]  Cephus Shelling   07/29/2020, 4:11 PM

## 2020-07-29 NOTE — Transfer of Care (Signed)
Immediate Anesthesia Transfer of Care Note  Patient: Frank Cummings  Procedure(s) Performed: ENDARTERECTOMY CAROTID LEFT (Left Neck)  Patient Location: PACU  Anesthesia Type:General  Level of Consciousness: awake, alert , oriented and patient cooperative  Airway & Oxygen Therapy: Patient Spontanous Breathing and Patient connected to nasal cannula oxygen  Post-op Assessment: Report given to RN, Post -op Vital signs reviewed and stable and Patient moving all extremities X 4  Post vital signs: Reviewed and stable  Last Vitals:  Vitals Value Taken Time  BP 116/79 07/29/20 1611  Temp    Pulse 109 07/29/20 1613  Resp 20 07/29/20 1613  SpO2 100 % 07/29/20 1613  Vitals shown include unvalidated device data.  Last Pain:  Vitals:   07/29/20 1033  TempSrc: Oral  PainSc:          Complications: No complications documented.

## 2020-07-30 ENCOUNTER — Encounter (HOSPITAL_COMMUNITY): Payer: Self-pay | Admitting: Vascular Surgery

## 2020-07-30 LAB — CBC
HCT: 31.5 % — ABNORMAL LOW (ref 39.0–52.0)
Hemoglobin: 11.1 g/dL — ABNORMAL LOW (ref 13.0–17.0)
MCH: 29.3 pg (ref 26.0–34.0)
MCHC: 35.2 g/dL (ref 30.0–36.0)
MCV: 83.1 fL (ref 80.0–100.0)
Platelets: 113 10*3/uL — ABNORMAL LOW (ref 150–400)
RBC: 3.79 MIL/uL — ABNORMAL LOW (ref 4.22–5.81)
RDW: 13.2 % (ref 11.5–15.5)
WBC: 11.7 10*3/uL — ABNORMAL HIGH (ref 4.0–10.5)
nRBC: 0 % (ref 0.0–0.2)

## 2020-07-30 LAB — BASIC METABOLIC PANEL
Anion gap: 8 (ref 5–15)
BUN: 10 mg/dL (ref 6–20)
CO2: 23 mmol/L (ref 22–32)
Calcium: 8.3 mg/dL — ABNORMAL LOW (ref 8.9–10.3)
Chloride: 106 mmol/L (ref 98–111)
Creatinine, Ser: 0.94 mg/dL (ref 0.61–1.24)
GFR, Estimated: >60 mL/min (ref >60)
Glucose, Bld: 131 mg/dL — ABNORMAL HIGH (ref 70–99)
Potassium: 3.7 mmol/L (ref 3.5–5.1)
Sodium: 137 mmol/L (ref 135–145)

## 2020-07-30 NOTE — Progress Notes (Signed)
Pt tolerated breakfast w/ no n/v. Ambulated the hall about 400 ft, only complaint was dizziness. Relieved upon returning to the room.

## 2020-07-30 NOTE — Progress Notes (Addendum)
  Progress Note    07/30/2020 8:56 AM 1 Day Post-Op  Subjective:  States he was hurting throughout the night also states he became very nauseated with dinner and vomited. Feeling better this morning   Vitals:   07/30/20 0415 07/30/20 0600  BP: (!) 114/58 106/60  Pulse: (!) 104 88  Resp: 18 14  Temp: 98 F (36.7 C) 98.2 F (36.8 C)  SpO2: 95% 93%   Physical Exam: Cardiac:  Sinus tachycardic Lungs: non labored Incisions: left neck incision is clean, dry and intact. No swelling or hematoma Extremities: moving all extremities without deficits Neurologic: alert and oriented. CN intact. Smile symmetric. Tongue midline. Strength equal bilaterally  CBC    Component Value Date/Time   WBC 11.7 (H) 07/30/2020 0423   RBC 3.79 (L) 07/30/2020 0423   HGB 11.1 (L) 07/30/2020 0423   HCT 31.5 (L) 07/30/2020 0423   PLT 113 (L) 07/30/2020 0423   MCV 83.1 07/30/2020 0423   MCH 29.3 07/30/2020 0423   MCHC 35.2 07/30/2020 0423   RDW 13.2 07/30/2020 0423   LYMPHSABS 2.1 07/15/2020 2004   MONOABS 0.4 07/15/2020 2004   EOSABS 0.2 07/15/2020 2004   BASOSABS 0.1 07/15/2020 2004    BMET    Component Value Date/Time   NA 137 07/30/2020 0423   K 3.7 07/30/2020 0423   CL 106 07/30/2020 0423   CO2 23 07/30/2020 0423   GLUCOSE 131 (H) 07/30/2020 0423   BUN 10 07/30/2020 0423   CREATININE 0.94 07/30/2020 0423   CALCIUM 8.3 (L) 07/30/2020 0423   GFRNONAA >60 07/30/2020 0423    INR    Component Value Date/Time   INR 1.0 07/29/2020 0950     Intake/Output Summary (Last 24 hours) at 07/30/2020 0856 Last data filed at 07/29/2020 1640 Gross per 24 hour  Intake 1550 ml  Output 900 ml  Net 650 ml     Assessment/Plan:  39 y.o. male is s/p Left carotid endarterectomy 1 Day Post-Op. He is doing well post op. Neurologically intact. Having surgical site pain. Had some nausea and vomiting over night so has not tolerated diet. Has not yet ambulated. Possible discharge home later today if he  is feeling better and ambulates without any issues. If he is comfortable with going home later today he will have follow up in 2-3 weeks with Dr. Chestine Spore. He will continue his Aspirin, Statin and Plavix  DVT prophylaxis: sq heparin   Graceann Congress, PA-C [facility] [phone] 07/30/2020 8:56 AM   I have seen and evaluated the patient. I agree with the PA note as documented above.  39 year old male postop day 1 status post left carotid endarterectomy for a left carotid web that was symptomatic.  Neck looks good this morning and neurologically intact.  He had some nausea and vomiting overnight but is feeling little better this morning.  We will see how he does getting out of bed to a chair, ambulating, and tolerating breakfast and lunch.  Possible late day discharge.  Continue aspirin Plavix  Cephus Shelling, MD [facility] [place] Office: [phone]

## 2020-07-30 NOTE — Anesthesia Postprocedure Evaluation (Signed)
Anesthesia Post Note  Patient: Frank Cummings  Procedure(s) Performed: ENDARTERECTOMY CAROTID LEFT (Left Neck)     Patient location during evaluation: PACU Anesthesia Type: General Level of consciousness: awake and alert Pain management: pain level controlled Vital Signs Assessment: post-procedure vital signs reviewed and stable Respiratory status: spontaneous breathing, nonlabored ventilation, respiratory function stable and patient connected to nasal cannula oxygen Cardiovascular status: blood pressure returned to baseline and stable Postop Assessment: no apparent nausea or vomiting Anesthetic complications: no   No complications documented.  Last Vitals:  Vitals:   07/30/20 0600 07/30/20 0848  BP: 106/60 113/61  Pulse: 88   Resp: 14 19  Temp: 36.8 C 36.7 C  SpO2: 93% 96%    Last Pain:  Vitals:   07/30/20 0848  TempSrc: Oral  PainSc:                  [name]

## 2020-07-31 LAB — POCT I-STAT 7, (LYTES, BLD GAS, ICA,H+H)
Acid-Base Excess: 2 mmol/L (ref 0.0–2.0)
Bicarbonate: 25.8 mmol/L (ref 20.0–28.0)
Calcium, Ion: 1.21 mmol/L (ref 1.15–1.40)
HCT: 41 % (ref 39.0–52.0)
Hemoglobin: 13.9 g/dL (ref 13.0–17.0)
O2 Saturation: 100 %
Potassium: 3.9 mmol/L (ref 3.5–5.1)
Sodium: 141 mmol/L (ref 135–145)
TCO2: 27 mmol/L (ref 22–32)
pCO2 arterial: 37.1 mmHg (ref 32.0–48.0)
pH, Arterial: 7.45 (ref 7.350–7.450)
pO2, Arterial: 167 mmHg — ABNORMAL HIGH (ref 83.0–108.0)

## 2020-07-31 LAB — POCT ACTIVATED CLOTTING TIME
Activated Clotting Time: 350 seconds
Activated Clotting Time: 362 seconds
Activated Clotting Time: 142 seconds

## 2020-07-31 MED ORDER — SODIUM CHLORIDE 0.9 % IV SOLN: INTRAVENOUS | Status: DC

## 2020-07-31 MED ORDER — TRAMADOL HCL 50 MG PO TABS
50.0000 mg | ORAL_TABLET | Freq: Four times a day (QID) | ORAL | Status: DC | PRN
  Administered 2020-07-31: 50 mg via ORAL
  Filled 2020-07-31: qty 1

## 2020-07-31 MED ORDER — OXYCODONE-ACETAMINOPHEN 5-325 MG PO TABS: 1.0000 | ORAL_TABLET | ORAL | 0 refills | Status: DC | PRN

## 2020-07-31 MED ORDER — TRAMADOL HCL 50 MG PO TABS: 50.0000 mg | ORAL_TABLET | Freq: Four times a day (QID) | ORAL | Status: DC

## 2020-07-31 NOTE — Plan of Care (Signed)
  Problem: Health Behavior/Discharge Planning: Goal: Ability to manage health-related needs will improve Outcome: Progressing   Problem: Clinical Measurements: Goal: Respiratory complications will improve Outcome: Progressing Goal: Cardiovascular complication will be avoided Outcome: Progressing   

## 2020-07-31 NOTE — Discharge Summary (Addendum)
Discharge Summary     Frank Cummings Mar 05, 1981 39 y.o. 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 male with history of hyperlipidemia thatpresents for hospital follow-up to discuss left carotid intervention. 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 We recommended left carotid endarterectomy in the hospital but patient wanted to go home and think about this. He had had no additional neurologic events since discharge. He states the numbness in his right hand has resolved.He is on dual antiplatelet therapy with aspirin Plavix.  Hospital Course:  The patient was admitted to the hospital and taken to the operating room on 07/29/2020 and underwent left carotid endarterectomy.    Findings: After the left carotid artery was opened there was a carotid web in the proximal ICA.  This was subsequently removed with endarterectomy.  The distal endpoint in the ICA was tacked down and bovine patch angioplasty was performed.  Good doppler signals and no flaps on carotid ultrasound at the completion of the case.   The pt tolerated the procedure well and was transported to the PACU in good condition.   By POD 1, the pt neuro status was intact.  Neck looks good this morning and neurologically intact.  He had some nausea and vomiting overnight but is feeling little better this morning.  He was kept an additional day.  By POD 2, he was doing well and feeling better.  He continued to be neuro intact.  He is discharged home.  He was having more nausea later in the day.  He was kept an additional day.    POD 3, he was doing well  without difficulty swallowing.  He did not have any other nausea.  He is discharged home.   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 Thrombocytopenia (HCC) 07/17/2020  . Elevated ALT measurement 07/17/2020  . 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 Hyperlipemia 07/16/2020  . Hypertriglyceridemia 07/16/2020  . CVA (cerebral vascular accident) (HCC) 07/16/2020  . Right sided numbness 07/15/2020  . Acute ischemic stroke (HCC) 07/15/2020   Past Medical History:  Diagnosis Date  . Acute ischemic  stroke (HCC) 07/15/2020  . Arthritis    gout  . Bell's palsy   . Depression   . Elevated ALT measurement 07/17/2020  . Headache   . High cholesterol   . Hypertriglyceridemia 07/16/2020  . Thrombocytopenia (HCC) 07/17/2020    Allergies as of 07/31/2020   No Known Allergies     Medication List    TAKE these medications   acetaminophen 325 MG tablet Commonly known as: TYLENOL Take 325 mg by mouth every 6 (six) hours as needed for mild pain.   aspirin 81 MG EC tablet Take 1 tablet (81 mg total) by mouth daily. Swallow whole.   atorvastatin 40 MG tablet Commonly known as: LIPITOR Take 1 tablet (40 mg total) by mouth  daily.   clopidogrel 75 MG tablet Commonly known as: PLAVIX Take 1 tablet (75 mg total) by mouth daily. Take for 3 weeks only, then stop.   oxyCODONE-acetaminophen 5-325 MG tablet Commonly known as: Percocet Take 1 tablet by mouth every 4 (four) hours as needed for severe pain.        [facility] Discharge Instructions Carotid Endarterectomy (CEA)  Please refer to the following instructions for your post-procedure care. Your surgeon or physician assistant will discuss any changes with you.  Activity  You are encouraged to walk as much as you can. You can slowly return to normal activities but must avoid strenuous activity and heavy lifting until your doctor tell you it's OK. Avoid activities such as vacuuming or swinging a golf club. You can drive after one week if you are comfortable and you are no longer taking prescription pain medications. It is normal to feel tired for serval weeks after your surgery. It is also normal to have difficulty with sleep habits, eating, and bowel movements after surgery. These will go away with time.  Bathing/Showering  You may shower after you come home. Do not soak in a bathtub, hot tub, or swim until the incision heals completely.  Incision Care  Shower every day. Clean your incision with mild soap and water. Pat the area dry with a clean towel. You do not need a bandage unless otherwise instructed. Do not apply any ointments or creams to your incision. You may have skin glue on your incision. Do not peel it off. It will come off on its own in about one week. Your incision may feel thickened and raised for several weeks after your surgery. This is normal and the skin will soften over time. For Men Only: It's OK to shave around the incision but do not shave the incision itself for 2 weeks. It is common to have numbness under your chin that could last for several months.  Diet  Resume your normal diet. There are no  special food restrictions following this procedure. A low fat/low cholesterol diet is recommended for all patients with vascular disease. In order to heal from your surgery, it is CRITICAL to get adequate nutrition. Your body requires vitamins, minerals, and protein. Vegetables are the best source of vitamins and minerals. Vegetables also provide the perfect balance of protein. Processed food has little nutritional value, so try to avoid this.  Medications  Resume taking all of your medications unless your doctor or physician assistant tells you not to.  If your incision is causing pain, you may take over-the- counter pain relievers such as acetaminophen (Tylenol). If you were prescribed a stronger pain medication, please be aware these medications can cause nausea and constipation.  Prevent nausea by taking the medication with a snack or meal. Avoid constipation by drinking plenty of fluids and eating foods with a high amount of fiber, such as fruits, vegetables, and grains.  Do not take Tylenol if you are taking prescription pain medications.  Follow Up  Our office will schedule a follow up appointment 2-3 weeks following discharge.  Please call us immediately for any of the following conditions  . Increased pain, redness, drainage (pus) from your incision site. . Fever of 101 degrees or higher. . If you should develop stroke (slurred speech, difficulty swallowing, weakness on one side of your body, loss of vision) you should call 911 and go to the nearest emergency room. .  Reduce your risk of vascular disease:  . Stop smoking. If you would like help call [facility] at 1-800-QUIT-NOW ((330) 564-6690) or [place] at (832) [phone]. . Manage your cholesterol . Maintain a desired weight . Control your diabetes . Keep your blood pressure down .  If you have any questions, please call the office at [phone].  Prescriptions given: 1.   Roxicet #15 No Refill-PDMP reviewed  Disposition:  home  Patient's condition: is Good  Follow up: 1. Dr. Chestine Spore in 2 weeks.   Doreatha Massed, PA-C [facility] [phone]   --- For [facility] use ---   Modified Rankin score at D/C (0-6): 0  IV medication needed for:  1. Hypertension: No 2. Hypotension: No  Post-op Complications: No  1. Post-op CVA or TIA: No  If yes: Event classification (right eye, left eye, right cortical, left cortical, verterobasilar, other): n/a  If yes: Timing of event (intra-op, <6 hrs post-op, >=6 hrs post-op, unknown): n/a  2. CN injury: No  If yes: CN n/a injuried   3. Myocardial infarction: No  If yes: Dx by (EKG or clinical, Troponin): na/  4.  CHF: No  5.  Dysrhythmia (new): No  6. Wound infection: No  7. Reperfusion symptoms: No  8. Return to OR: No  If yes: return to OR for (bleeding, neurologic, other CEA incision, other): n/a  Discharge medications: Statin use:  Yes ASA use:  Yes   Beta blocker use:  No ACE-Inhibitor use:  No  ARB use:  No CCB use: No P2Y12 Antagonist use: Yes, [ ]  Plavix, [ ]  Plasugrel, [ ]  Ticlopinine, [ ]  Ticagrelor, [ ]  Other, [ ]  No for medical reason, [ ]  Non-compliant, [ ]  Not-indicated Anti-coagulant use:  No, [ ]  Warfarin, [ ]  Rivaroxaban, [ ]  Dabigatran,

## 2020-07-31 NOTE — Progress Notes (Addendum)
  Progress Note    07/31/2020 10:06 AM 2 Days Post-Op  Subjective:  Says he is feeling better.  Denies trouble swallowing.  Tm 99.5 now afebrile HR 60's-90's NSR 100's-120's systolic 92% RA  Gtts:  None  Vitals:   07/31/20 0300 07/31/20 0800  BP: 120/62 107/73  Pulse: 87 72  Resp: 20 16  Temp: 99.5 F (37.5 C) 97.8 F (36.6 C)  SpO2: 94% 97%     Physical Exam: Neuro:  In tact; tongue is midline; moving all extremities equally Lungs:  Non labored Incision:  Clean and dry without hematoma  CBC    Component Value Date/Time   WBC 11.7 (H) 07/30/2020 0423   RBC 3.79 (L) 07/30/2020 0423   HGB 11.1 (L) 07/30/2020 0423   HCT 31.5 (L) 07/30/2020 0423   PLT 113 (L) 07/30/2020 0423   MCV 83.1 07/30/2020 0423   MCH 29.3 07/30/2020 0423   MCHC 35.2 07/30/2020 0423   RDW 13.2 07/30/2020 0423   LYMPHSABS 2.1 07/15/2020 2004   MONOABS 0.4 07/15/2020 2004   EOSABS 0.2 07/15/2020 2004   BASOSABS 0.1 07/15/2020 2004    BMET    Component Value Date/Time   NA 137 07/30/2020 0423   K 3.7 07/30/2020 0423   CL 106 07/30/2020 0423   CO2 23 07/30/2020 0423   GLUCOSE 131 (H) 07/30/2020 0423   BUN 10 07/30/2020 0423   CREATININE 0.94 07/30/2020 0423   CALCIUM 8.3 (L) 07/30/2020 0423   GFRNONAA >60 07/30/2020 0423     Intake/Output Summary (Last 24 hours) at 07/31/2020 1006 Last data filed at 07/31/2020 0417 Gross per 24 hour  Intake 2272.5 ml  Output 950 ml  Net 1322.5 ml     Assessment/Plan:  This is a 39 y.o. male who is s/p left CEA 2 Days Post-Op  -pt is doing well this am and feeling much better.  -pt neuro exam is in tact -pt has ambulated -pt has voided -f/u with Dr. Chestine Spore in 2-3 weeks.   Doreatha Massed, PA-C [facility] [phone]   I have interviewed and examined patient with PA and agree with assessment and plan above.  Unfortunately patient has having persistent nausea and vomiting and will need to stay for IV fluid hydration  overnight.  Odyssey Vasbinder C. Randie Heinz, MD [facility] [place] Office: [phone] Pager: [phone]

## 2020-08-01 NOTE — Discharge Instructions (Signed)
° °  [facility]  Discharge Instructions   Carotid Surgery  Please refer to the following instructions for your post-procedure care. Your surgeon or physician assistant will discuss any changes with you.  Activity  You are encouraged to walk as much as you can. You can slowly return to normal activities but must avoid strenuous activity and heavy lifting until your doctor tell you it's okay. Avoid activities such as vacuuming or swinging a golf club. You can drive after one week if you are comfortable and you are no longer taking prescription pain medications. It is normal to feel tired for serval weeks after your surgery. It is also normal to have difficulty with sleep habits, eating, and bowel movements after surgery. These will go away with time.  Bathing/Showering  Shower daily after you go home. Do not soak in a bathtub, hot tub, or swim until the incision heals completely.  Incision Care  Shower every day. Clean your incision with mild soap and water. Pat the area dry with a clean towel. You do not need a bandage unless otherwise instructed. Do not apply any ointments or creams to your incision. You may have skin glue on your incision. Do not peel it off. It will come off on its own in about one week. Your incision may feel thickened and raised for several weeks after your surgery. This is normal and the skin will soften over time.   For Men Only: It's okay to shave around the incision but do not shave the incision itself for 2 weeks. It is common to have numbness under your chin that could last for several months.  Diet  Resume your normal diet. There are no special food restrictions following this procedure. A low fat/low cholesterol diet is recommended for all patients with vascular disease. In order to heal from your surgery, it is CRITICAL to get adequate nutrition. Your body requires vitamins, minerals, and protein. Vegetables are the best source of  vitamins and minerals. Vegetables also provide the perfect balance of protein. Processed food has little nutritional value, so try to avoid this.  Medications  Resume taking all of your medications unless your doctor or physician assistant tells you not to. If your incision is causing pain, you may take over-the- counter pain relievers such as acetaminophen (Tylenol). If you were prescribed a stronger pain medication, please be aware these medications can cause nausea and constipation. Prevent nausea by taking the medication with a snack or meal. Avoid constipation by drinking plenty of fluids and eating foods with a high amount of fiber, such as fruits, vegetables, and grains.  Do not take Tylenol if taking pain medications.  Follow Up  Our office will schedule a follow up appointment 2-3 weeks following discharge.  Please call us immediately for any of the following conditions   Increased pain, redness, drainage (pus) from your incision site.  Fever of 101 degrees or higher.  If you should develop stroke (slurred speech, difficulty swallowing, weakness on one side of your body, loss of vision) you should call 911 and go to the nearest emergency room.   Reduce your risk of vascular disease:   Stop smoking. If you would like help call [facility] at 1-800-QUIT-NOW (863-171-3024) or [place] at [phone].  Manage your cholesterol  Maintain a desired weight  Control your diabetes  Keep your blood pressure down   If you have any questions, please call the office at [phone].

## 2020-08-01 NOTE — Progress Notes (Addendum)
  Progress Note    08/01/2020 7:43 AM 3 Days Post-Op  Subjective:  Feels better.  Nausea resolved.  No difficulty swallowing.  Afebrile VSS  Vitals:   08/01/20 0418 08/01/20 0741  BP: 120/73 127/81  Pulse:  73  Resp: 16 18  Temp: 98.4 F (36.9 C) 98.7 F (37.1 C)  SpO2: 95% 96%     Physical Exam: Neuro:  In tact Lungs:  Non labored Incision:  Clean and dry  CBC    Component Value Date/Time   WBC 11.7 (H) 07/30/2020 0423   RBC 3.79 (L) 07/30/2020 0423   HGB 11.1 (L) 07/30/2020 0423   HCT 31.5 (L) 07/30/2020 0423   PLT 113 (L) 07/30/2020 0423   MCV 83.1 07/30/2020 0423   MCH 29.3 07/30/2020 0423   MCHC 35.2 07/30/2020 0423   RDW 13.2 07/30/2020 0423   LYMPHSABS 2.1 07/15/2020 2004   MONOABS 0.4 07/15/2020 2004   EOSABS 0.2 07/15/2020 2004   BASOSABS 0.1 07/15/2020 2004    BMET    Component Value Date/Time   NA 137 07/30/2020 0423   K 3.7 07/30/2020 0423   CL 106 07/30/2020 0423   CO2 23 07/30/2020 0423   GLUCOSE 131 (H) 07/30/2020 0423   BUN 10 07/30/2020 0423   CREATININE 0.94 07/30/2020 0423   CALCIUM 8.3 (L) 07/30/2020 0423   GFRNONAA >60 07/30/2020 0423     Intake/Output Summary (Last 24 hours) at 08/01/2020 0743 Last data filed at 08/01/2020 0743 Gross per 24 hour  Intake 1901.46 ml  Output 950 ml  Net 951.46 ml     Assessment/Plan:  This is a 39 y.o. male who is s/p left CEA 3 Days Post-Op  -pt is doing well this am. -pt neuro exam is in tact -pt has ambulated -pt has voided -f/u with Dr. Chestine Spore in 2 weeks.   Doreatha Massed, PA-C [facility] [phone]   I have interviewed and examined patient with PA and agree with assessment and plan above.  Stable left neck hematoma and patient is swallowing well nausea is improved we will plan for discharge today.  Cara Aguino C. Randie Heinz, MD [facility] [place] Office: [phone] Pager: [phone]

## 2020-08-03 ENCOUNTER — Other Ambulatory Visit: Payer: Self-pay | Admitting: *Deleted

## 2020-08-03 NOTE — Patient Outreach (Signed)
[facility]) Care Management  08/03/2020  Frank Cummings 1981/02/13 [id number]   RED ON EMMI ALERT - Stroke Day # 13 Date: 12/25 Red Alert Reason: Questions/problems with medications and Problems filling medications   Outreach attempt #1, successful.  He report being discharged from having endarterectomy on 12/25, wasn't able to fill prescription because the pharmacy was closed.  He has since obtained the medication and taking as instructed.  Medication list reviewed, denies any other questions.  He will finish his plavix course, has one more week left.    He has some reported dull pain on the left side of his neck, taking Percocet for pain relief, rate at 3/10 today.  Follow up with vascular MD on 1/4 and with neuro on 1/18.  His brother will provide transportation.  He also confirms that he will start PT with [facility] on [address] on 1/10.  Denies any further questions at this tim.   Plan: RN CM will close case at this time, no further needs identified.  Advised member to call this care manager if questions arise.

## 2020-08-05 ENCOUNTER — Ambulatory Visit: Payer: 59 | Admitting: *Deleted

## 2020-08-11 ENCOUNTER — Ambulatory Visit (INDEPENDENT_AMBULATORY_CARE_PROVIDER_SITE_OTHER): Payer: Self-pay | Admitting: Vascular Surgery

## 2020-08-11 ENCOUNTER — Other Ambulatory Visit: Payer: Self-pay

## 2020-08-11 ENCOUNTER — Encounter: Payer: Self-pay | Admitting: Vascular Surgery

## 2020-08-11 VITALS — BP 129/81 | HR 88 | Temp 98.0°F | Resp 18 | Ht 62.0 in | Wt 155.0 lb

## 2020-08-11 DIAGNOSIS — I639 Cerebral infarction, unspecified: Secondary | ICD-10-CM

## 2020-08-11 DIAGNOSIS — I779 Disorder of arteries and arterioles, unspecified: Secondary | ICD-10-CM

## 2020-08-11 MED ORDER — CLOPIDOGREL BISULFATE 75 MG PO TABS: 75.0000 mg | ORAL_TABLET | Freq: Every day | ORAL | 0 refills | Status: DC

## 2020-08-11 NOTE — Progress Notes (Signed)
Patient name: Frank Cummings MRN: [id number] DOB: 1980-08-29 Sex: male  REASON FOR VISIT: Post-op check after left carotid endarterectomy  HPI: Frank Cummings is a 40 y.o. male that presents for postop check after left carotid endarterectomy on 07/29/2020 for symptomatic left carotid web.  Ultimately he has done well since discharge.  His only complaint is a little numbness under the left jaw.  Otherwise eating and drinking fine.  The nausea he had in the hospital resolved prior to discharge.  States he did run out of his Plavix prescription yesterday but otherwise has been on dual antiplatelet therapy. No neurologic events since discharge.  Past Medical History:  Diagnosis Date  . Acute ischemic stroke (HCC) 07/15/2020  . Arthritis    gout  . Bell's palsy   . Depression   . Elevated ALT measurement 07/17/2020  . Headache   . High cholesterol   . Hypertriglyceridemia 07/16/2020  . Thrombocytopenia (HCC) 07/17/2020    Past Surgical History:  Procedure Laterality Date  . ENDARTERECTOMY Left 07/29/2020   Procedure: ENDARTERECTOMY CAROTID LEFT;  Surgeon: Cephus Shelling, MD;  [facility];  Service: Vascular;  Laterality: Left;  . TEE WITHOUT CARDIOVERSION N/A 07/17/2020   Procedure: TRANSESOPHAGEAL ECHOCARDIOGRAM (TEE);  Surgeon: Dolores Patty, MD;  Location[facility] ENDOSCOPY;  Service: Cardiovascular;  Laterality: N/A;    Family History  Problem Relation Age of Onset  . Cancer Mother   . Hypertension Father     SOCIAL HISTORY: Social History   Tobacco Use  . Smoking status: Former Smoker    Quit date: 07/15/2020    Years since quitting: 0.0  . Smokeless tobacco: Never Used  Substance Use Topics  . Alcohol use: Yes    Comment: occasional    No Known Allergies  Current Outpatient Medications  Medication Sig Dispense Refill  . acetaminophen (TYLENOL) 325 MG tablet Take 325 mg by mouth every 6 (six) hours as needed for mild pain.    Marland Kitchen aspirin EC 81 MG EC tablet Take  1 tablet (81 mg total) by mouth daily. Swallow whole.    Marland Kitchen atorvastatin (LIPITOR) 40 MG tablet Take 1 tablet (40 mg total) by mouth daily. 30 tablet 2  . clopidogrel (PLAVIX) 75 MG tablet Take 1 tablet (75 mg total) by mouth daily. Take for 3 weeks only, then stop. 21 tablet 0  . oxyCODONE-acetaminophen (PERCOCET) 5-325 MG tablet Take 1 tablet by mouth every 4 (four) hours as needed for severe pain. (Patient not taking: Reported on 08/11/2020) 15 tablet 0   No current facility-administered medications for this visit.    REVIEW OF SYSTEMS:  [X]  denotes positive finding, [ ]  denotes negative finding Cardiac  Comments:  Chest pain or chest pressure:    Shortness of breath upon exertion:    Short of breath when lying flat:    Irregular heart rhythm:        Vascular    Pain in calf, thigh, or hip brought on by ambulation:    Pain in feet at night that wakes you up from your sleep:     Blood clot in your veins:    Leg swelling:         Pulmonary    Oxygen at home:    Productive cough:     Wheezing:         Neurologic    Sudden weakness in arms or legs:     Sudden numbness in arms or legs:     Sudden onset  of difficulty speaking or slurred speech:    Temporary loss of vision in one eye:     Problems with dizziness:         Gastrointestinal    Blood in stool:     Vomited blood:         Genitourinary    Burning when urinating:     Blood in urine:        Psychiatric    Major depression:         Hematologic    Bleeding problems:    Problems with blood clotting too easily:        Skin    Rashes or ulcers:        Constitutional    Fever or chills:      PHYSICAL EXAM: Vitals:   08/11/20 0831 08/11/20 0832  BP: 125/83 129/81  Pulse: 88 88  Resp: 18   Temp: 98 F (36.7 C)   TempSrc: Temporal   SpO2: 96%   Weight: 155 lb (70.3 kg)   Height: 5\' 2"  (1.575 m)     GENERAL: The patient is a well-nourished male, in no acute distress. The vital signs are documented  above. CARDIAC: There is a regular rate and rhythm.  VASCULAR:  Left neck incision clean dry and intact with no hematoma NEURO: No focal deficits.  Neurologically intact.  DATA:   None  Assessment/Plan:  40 year old male status post left carotid endarterectomy on 07/29/2020 for symptomatic carotid web.  Discussed that overall his incision looks good and he appears to be making good progress postoperatively.  I did refill his Plavix prescription and discussed I want him on dual antiplatelet therapy for at least 1 month postop and then he can continue just aspirin and statin for overall risk reduction.  He can return to work 2 weeks after surgery as we discussed today.  I will see him in 9 months with carotid duplex here in the office for continued surveillance.   07/31/2020, MD [facility] [place] Office: [phone]

## 2020-08-12 ENCOUNTER — Other Ambulatory Visit: Payer: Self-pay

## 2020-08-12 DIAGNOSIS — I639 Cerebral infarction, unspecified: Secondary | ICD-10-CM

## 2020-08-17 DIAGNOSIS — I63032 Cerebral infarction due to thrombosis of left carotid artery: Secondary | ICD-10-CM | POA: Diagnosis not present

## 2020-08-17 DIAGNOSIS — R531 Weakness: Secondary | ICD-10-CM | POA: Diagnosis not present

## 2020-08-18 ENCOUNTER — Other Ambulatory Visit: Payer: 59

## 2020-08-20 DIAGNOSIS — R531 Weakness: Secondary | ICD-10-CM | POA: Diagnosis not present

## 2020-08-20 DIAGNOSIS — I63032 Cerebral infarction due to thrombosis of left carotid artery: Secondary | ICD-10-CM | POA: Diagnosis not present

## 2020-08-25 ENCOUNTER — Inpatient Hospital Stay: Payer: 59 | Admitting: Adult Health

## 2020-08-26 DIAGNOSIS — I63032 Cerebral infarction due to thrombosis of left carotid artery: Secondary | ICD-10-CM | POA: Diagnosis not present

## 2020-08-26 DIAGNOSIS — R531 Weakness: Secondary | ICD-10-CM | POA: Diagnosis not present

## 2020-08-28 ENCOUNTER — Telehealth: Payer: Self-pay | Admitting: Adult Health

## 2020-08-28 NOTE — Telephone Encounter (Signed)
I lvm for pt to call back to r/s appt that was on cancelled on 1/18 due to office closed for weather

## 2020-08-29 ENCOUNTER — Other Ambulatory Visit: Payer: Self-pay | Admitting: Vascular Surgery

## 2020-08-31 DIAGNOSIS — R531 Weakness: Secondary | ICD-10-CM | POA: Diagnosis not present

## 2020-08-31 DIAGNOSIS — I63032 Cerebral infarction due to thrombosis of left carotid artery: Secondary | ICD-10-CM | POA: Diagnosis not present

## 2020-08-31 NOTE — Progress Notes (Signed)
[facility] [address] [place]. [place] [address] [phone]       HOSPITAL FOLLOW UP NOTE  [name] Date of Birth:  04-23-1981 Medical Record Number:  [id number]   Reason for Referral:  hospital stroke follow up    SUBJECTIVE:   CHIEF COMPLAINT:  Chief Complaint  Patient presents with  . Follow-up    RM 14 alone PT is well, still some Right sided weakness/numbness    HPI:   [name] is a 40 y.o. male with history of Bell's palsy in 2017 (resolved) who presented to [facility] ED on 07/15/2020 with right-sided numbness.  Personally reviewed hospitalization pertinent progress notes, lab update 5 and imaging with summary provided.  Evaluated by Dr. 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  Recommended holding off on any type of surgical procedure until other etiologies ruled out.  TEE showed very trivial PFO felt to not be of clinical significance and LE Dopplers negative for DVT.  Hypercoagulable labs pending at discharge.  Recommended DAPT for 3 weeks followed by aspirin alone.  LDL 135 and initiate atorvastatin 40 mg daily.  No prior history of HTN or DM with A1c 5.3.  No prior stroke history.  Evaluated by the cardiologist visit she received CPR seen and recommended discharge home with outpatient PT/OT.  Stroke:  Left subcortical stroke, given size, location and abnormal LICA web, this is likely embolic (cardioemboic vs large vessle embolic etiology from possible LICA web abnormality under investigation Code Stroke CT head No acute abnormality. ASPECTS 10.     CTA head & neck:  No large vessel occlusion or hemodynamically significant proximal stenosis in the head or neck. 2. 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 This finding has been reported as having an increased risk of  ischemic stroke, thus we will check a CUS to better evaluate for this  MRI:  2 cm curvilinear acute ischemic perforator type infarct extending from the posterior left lentiform nucleus towards the left corona radiata.   Carotid Doppler no significant bilateral extracranial stenosis.  2D Echo w/Bubble: Slight inferior wall hypokinesis.  Ejection fraction 60 to 65%.  LE muscle no significant next is my dictation small and he does take his mind this for you just rolling is a blood is cleared medically constantly is like it is such a big difference MSSA its: Negative for DVT recent Frank car ride noted, but no current DVT complaints  TEE: EF 60 to 65%; very trivial PFO felt to not be of clinical significance  TCD: negative   LDL 135  HgbA1c 5.3  none prior to admission, now on ASA 81mg  + Plavix 75mg  PO daily x3 weeks, then ASA monotherapy  Therapy recommendations:  OP PT/OT  Disposition:  home   Today, 09/01/2020, Frank Cummings is being seen for hospital follow-up unaccompanied.  He has been slowly recovering since discharge but reports continued gait impairment and altered right-sided sensory.  He has not done any type of therapy since discharge but is interested in PT.  He has returned back to all prior activities including working.  Denies new or worsening stroke/TIA symptoms.  Underwent left carotid endarterectomy on 07/29/2020 by Dr. Isidoro Donning without complication.  Plans on repeating carotid duplex 9 months post procedure with Dr. 07/31/2020.  He completed recommended 1 month DAPT course and remains on aspirin alone without  bleeding or bruising.  Remains on atorvastatin 40 mg daily without myalgias.  Blood pressure today satisfactory at 121/85.  No further concerns at this time.     ROS:   14 system review of systems performed and negative with exception of those listed in HPI  PMH:  Past Medical History:  Diagnosis Date  . Acute ischemic stroke (HCC) 07/15/2020  . Arthritis    gout  . Bell's  palsy   . Depression   . Elevated ALT measurement 07/17/2020  . Headache   . High cholesterol   . Hypertriglyceridemia 07/16/2020  . Thrombocytopenia (HCC) 07/17/2020    PSH:  Past Surgical History:  Procedure Laterality Date  . ENDARTERECTOMY Left 07/29/2020   Procedure: ENDARTERECTOMY CAROTID LEFT;  Surgeon: Cephus Shelling, MD;  Location: [facility] OR;  Service: Vascular;  Laterality: Left;  . TEE WITHOUT CARDIOVERSION N/A 07/17/2020   Procedure: TRANSESOPHAGEAL ECHOCARDIOGRAM (TEE);  Surgeon: Dolores Patty, MD;  Location: [facility];  Service: Cardiovascular;  Laterality: N/A;    Social History:  Social History   Socioeconomic History  . Marital status: Single    Spouse name: Not on file  . Number of children: Not on file  . Years of education: Not on file  . Highest education level: Not on file  Occupational History  . Not on file  Tobacco Use  . Smoking status: Former Smoker    Quit date: 07/15/2020    Years since quitting: 0.1  . Smokeless tobacco: Never Used  Substance and Sexual Activity  . Alcohol use: Yes    Comment: occasional  . Drug use: Never  . Sexual activity: Not Currently  Other Topics Concern  . Not on file  Social History Narrative  . Not on file   Social Determinants of Health   Financial Resource Strain: Not on file  Food Insecurity: Not on file  Transportation Needs: Not on file  Physical Activity: Not on file  Stress: Not on file  Social Connections: Not on file  Intimate Partner Violence: Not on file    Family History:  Family History  Problem Relation Age of Onset  . Cancer Mother   . Hypertension Father     Medications:   Current Outpatient Medications on File Prior to Visit  Medication Sig Dispense Refill  . aspirin EC 81 MG EC tablet Take 1 tablet (81 mg total) by mouth daily. Swallow whole.    Marland Kitchen atorvastatin (LIPITOR) 40 MG tablet Take 1 tablet (40 mg total) by mouth daily. 30 tablet 2  . clopidogrel (PLAVIX) 75 MG  tablet TAKE 1 TABLET(75 MG) BY MOUTH DAILY FOR 3 WEEKS THEN STOP 21 tablet 0   No current facility-administered medications on file prior to visit.    Allergies:  No Known Allergies    OBJECTIVE:  Physical Exam  Vitals:   09/01/20 1501  BP: 121/85  Pulse: 71  Weight: 161 lb 9.6 oz (73.3 kg)  Height: 5\' 2"  (1.575 m)   Body mass index is 29.56 kg/m. No exam data present  Post stroke PHQ 2/9 Depression screen PHQ 2/9 09/01/2020  Decreased Interest 0  Down, Depressed, Hopeless 0  PHQ - 2 Score 0     General: well developed, well nourished,  very pleasant middle-aged Asian male, seated, in no evident distress Head: head normocephalic and atraumatic.   Neck: supple with no carotid or supraclavicular bruits Cardiovascular: regular rate and rhythm, no murmurs Musculoskeletal: no deformity Skin:  no rash/petichiae; s/p L CEA surgical  incision healing well Vascular:  Normal pulses all extremities   Neurologic Exam Mental Status: Awake and fully alert.   Fluent speech and language.  Oriented to place and time. Recent and remote memory intact. Attention span, concentration and fund of knowledge appropriate. Mood and affect appropriate.  Cranial Nerves: Fundoscopic exam reveals sharp disc margins. Pupils equal, briskly reactive to light. Extraocular movements full without nystagmus. Visual fields full to confrontation. Hearing intact. Facial sensation intact. Face, tongue, palate moves normally and symmetrically.  Motor: Normal bulk and tone. Normal strength in all tested extremity muscles Sensory.: intact to touch , pinprick , position and vibratory sensation although does report "odd" sensation right upper and lower extremity.  Coordination: Rapid alternating movements normal in all extremities. Finger-to-nose and heel-to-shin performed accurately bilaterally. Gait and Station: Arises from chair without difficulty. Stance is normal. Gait demonstrates  steppage type gait and  occasional hyperextension of right leg and mild unsteadiness.  Able to tandem walk and heel toe with mild difficulty.  Romberg negative. Reflexes: 1+ and symmetric. Toes downgoing.     NIHSS  1 Modified Rankin  2      ASSESSMENT: Frank Cummings is a 40 y.o. 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 Vascular risk factors include HLD.      PLAN:  1. L BG stroke, cryptogenic:  a. Residual gait impairment and right-sided sensory impairment.  Referral placed to neuro rehab PT for likely further recovery.   b. Continue aspirin 81 mg daily  and atorvastatin 40 mg daily for secondary stroke prevention.   c. Discussed secondary stroke prevention measures and importance of close PCP follow up for aggressive stroke risk factor management  2. 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 Chestine Spore without complication.  Plans on repeating carotid duplex 9 months post procedure per Dr. Ophelia Charter recommendations 3. HLD: LDL goal <70. Recent LDL 135 therefore initiated atorvastatin 40 mg daily during recent stroke admission.  Request follow-up with PCP in 1 to 2 months for repeat lipid panel and ongoing prescribing of atorvastatin.  If unable to be completed, will plan on obtaining at follow-up visit    Follow up in 3 months or call earlier if needed   CC:  [facility] provider: Dr. Dannielle Karvonen, Clifton Custard, MD    I spent 45 minutes of face-to-face and non-face-to-face time with patient.  This included previsit chart review including hospitalization pertinent progress notes, lab work and imaging, lab review, study review, order entry, electronic health record documentation, patient education regarding recent stroke, residual deficits, importance of managing stroke risk factors and answered all other questions to patient  satisfaction   Ihor Austin, [facility]  [facility] [address] [place], [place] [address]  Phone [phone] Fax [phone] Note: This document was prepared with digital dictation and possible smart phrase technology. Any transcriptional errors that result from this process are unintentional.

## 2020-09-01 ENCOUNTER — Ambulatory Visit: Payer: BC Managed Care – PPO | Admitting: Adult Health

## 2020-09-01 ENCOUNTER — Telehealth: Payer: Self-pay

## 2020-09-01 ENCOUNTER — Encounter: Payer: Self-pay | Admitting: Adult Health

## 2020-09-01 ENCOUNTER — Ambulatory Visit
Admission: RE | Admit: 2020-09-01 | Discharge: 2020-09-01 | Disposition: A | Discharge status: Home / Self Care | Payer: BC Managed Care – PPO | Source: Ambulatory Visit | Attending: Family Medicine | Admitting: Family Medicine

## 2020-09-01 VITALS — BP 121/85 | HR 71 | Ht 62.0 in | Wt 161.6 lb

## 2020-09-01 DIAGNOSIS — I6381 Other cerebral infarction due to occlusion or stenosis of small artery: Secondary | ICD-10-CM | POA: Diagnosis not present

## 2020-09-01 DIAGNOSIS — I69398 Other sequelae of cerebral infarction: Secondary | ICD-10-CM | POA: Diagnosis not present

## 2020-09-01 DIAGNOSIS — R269 Unspecified abnormalities of gait and mobility: Secondary | ICD-10-CM

## 2020-09-01 DIAGNOSIS — R7989 Other specified abnormal findings of blood chemistry: Secondary | ICD-10-CM

## 2020-09-01 DIAGNOSIS — R200 Anesthesia of skin: Secondary | ICD-10-CM

## 2020-09-01 DIAGNOSIS — I6522 Occlusion and stenosis of left carotid artery: Secondary | ICD-10-CM

## 2020-09-01 DIAGNOSIS — K7689 Other specified diseases of liver: Secondary | ICD-10-CM | POA: Diagnosis not present

## 2020-09-01 DIAGNOSIS — E785 Hyperlipidemia, unspecified: Secondary | ICD-10-CM

## 2020-09-01 IMAGING — US US ABDOMEN LIMITED
1 series · 13 of 25 positions shown · non-contrast
Comparison: None.

CLINICAL DATA: Elevated liver function tests.

EXAM:
ULTRASOUND ABDOMEN LIMITED RIGHT UPPER QUADRANT

[Series 1: us abdomen limited · 0.16mm/px · 51 acquisitions, 13 frames shown]
[im 1/51]
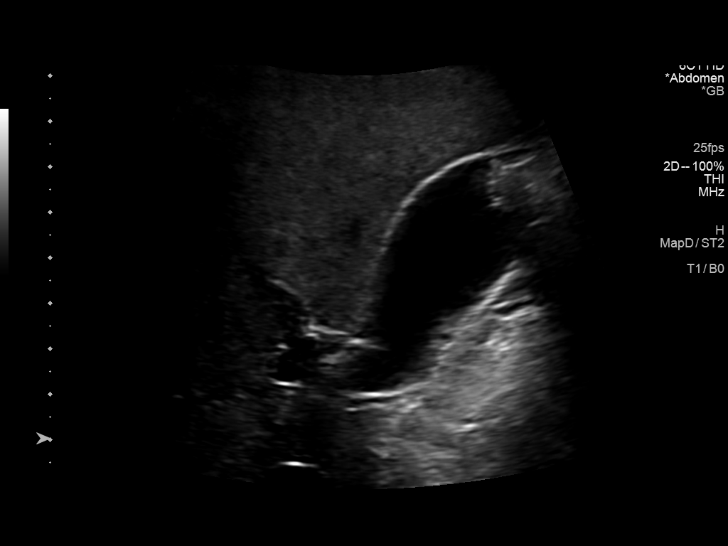
[im 5/51]
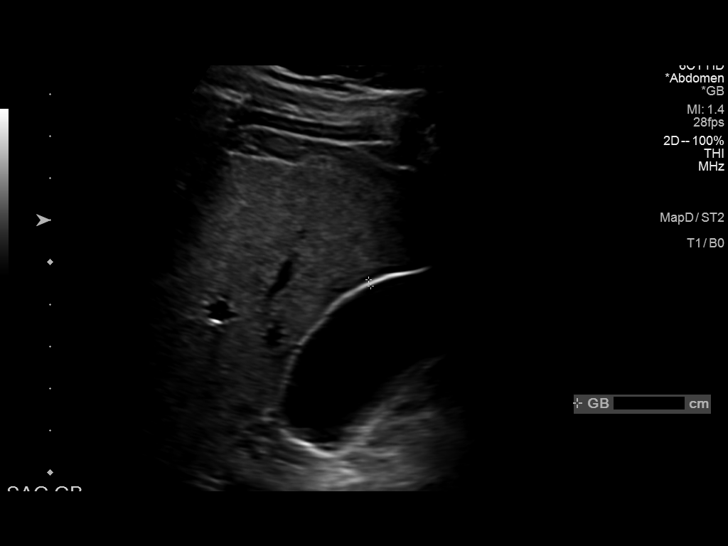
[im 9/51]
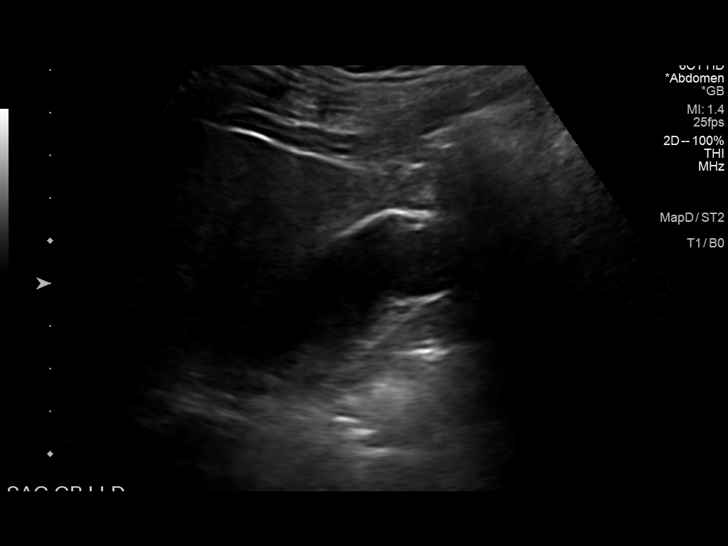
[im 13/51]
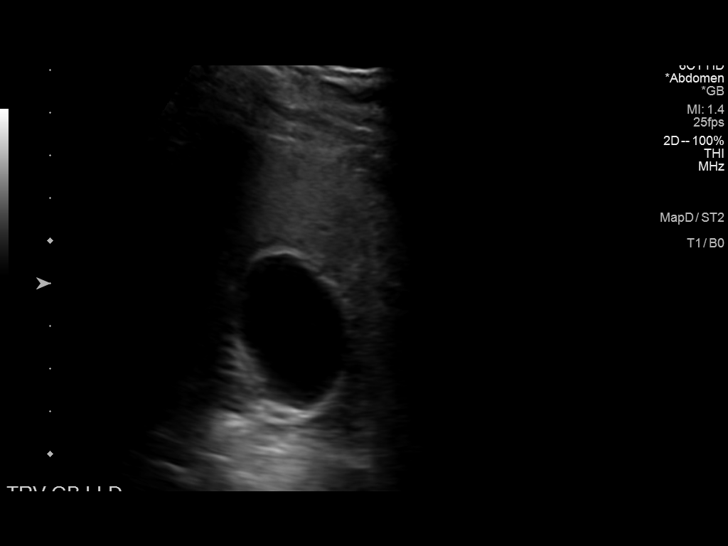
[im 17/51]
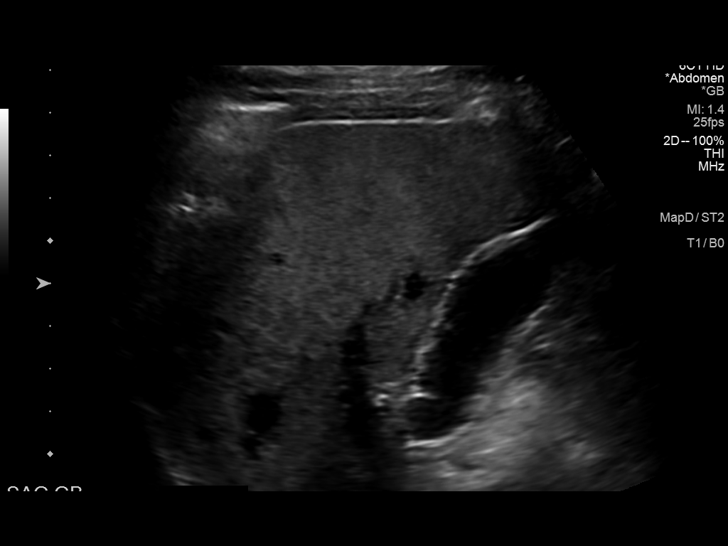
[im 21/51]
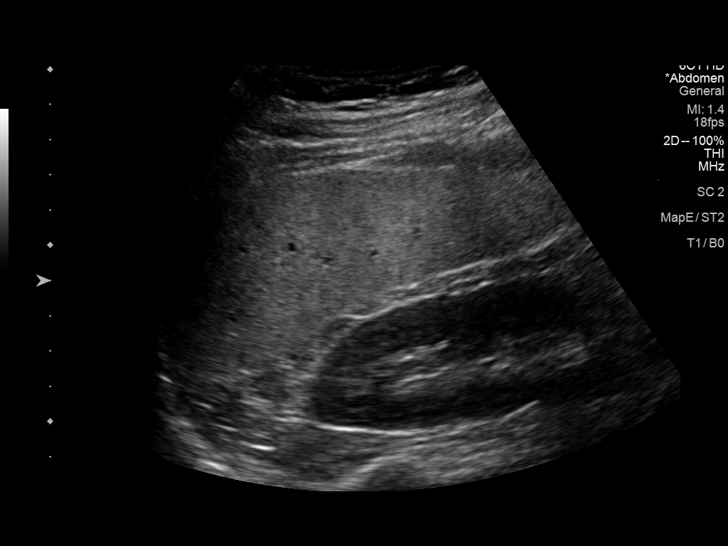
[im 26/51]
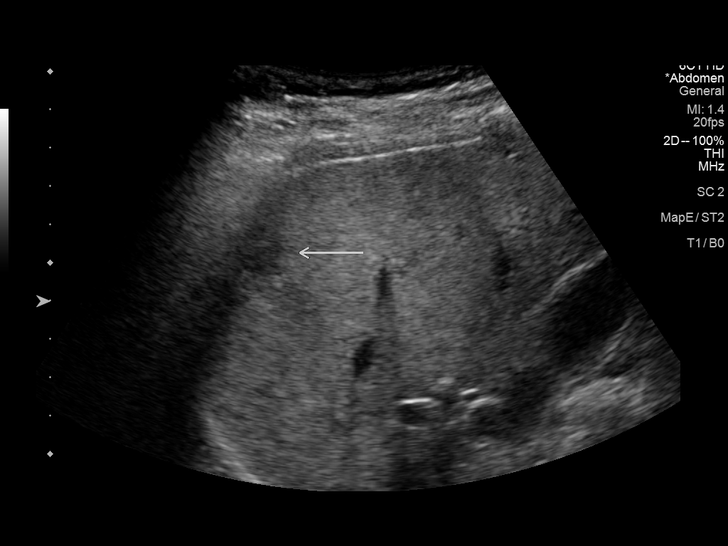
[im 30/51]
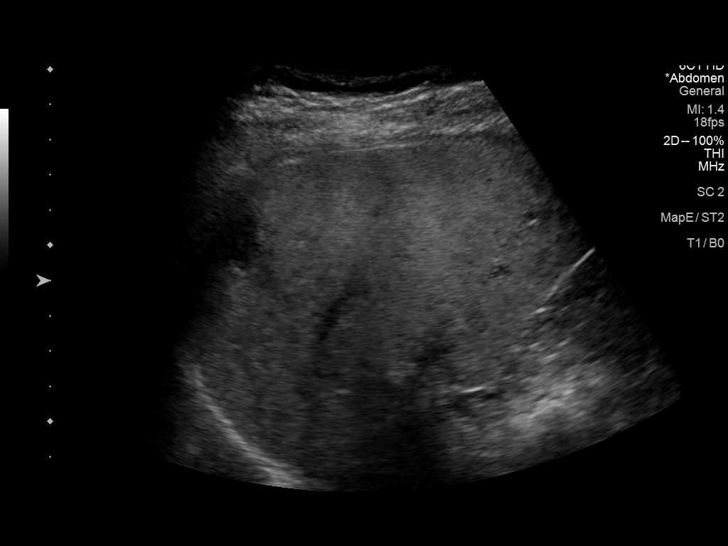
[im 34/51]
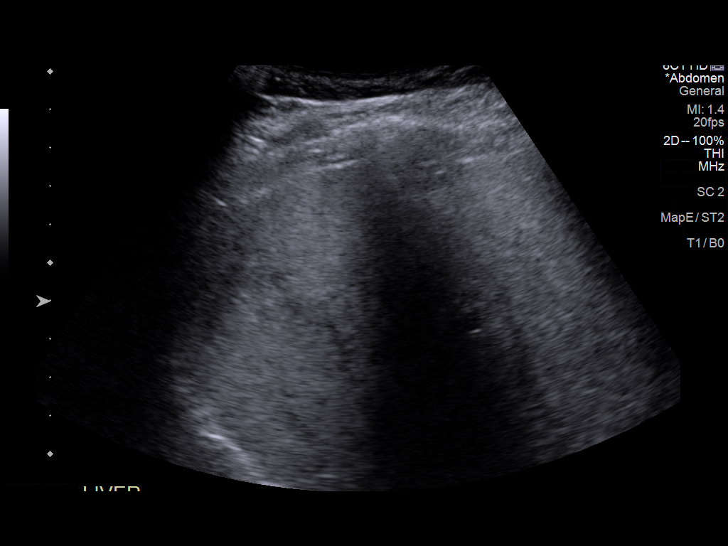
[im 38/51]
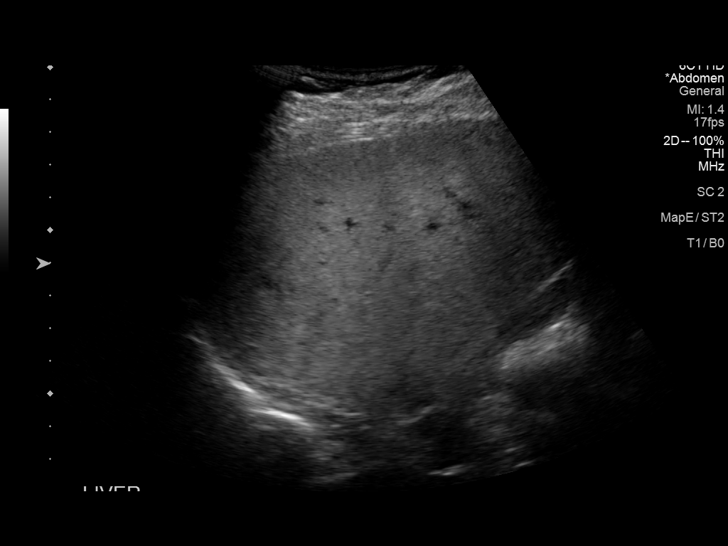
[im 42/51]
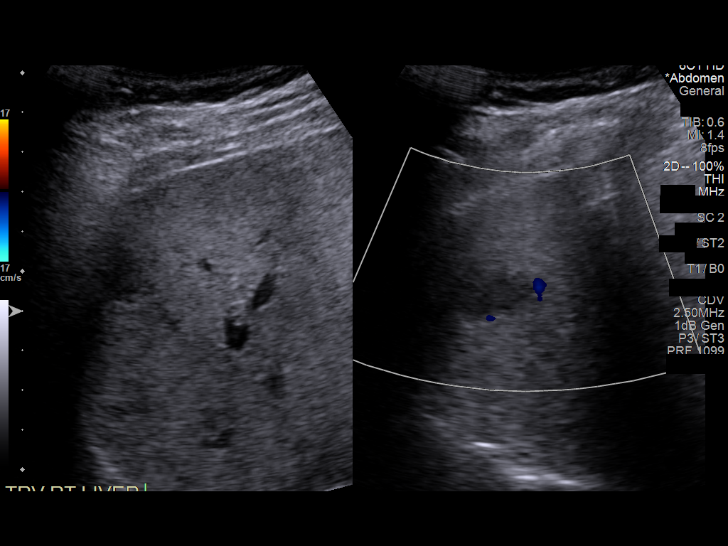
[im 46/51]
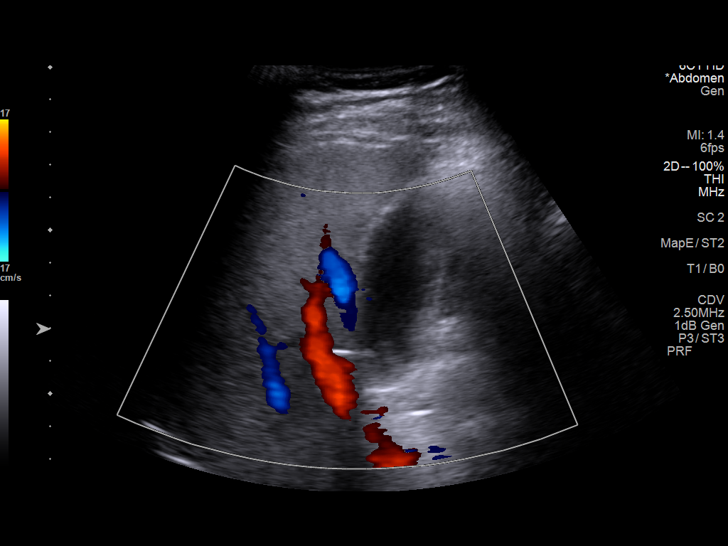
[im 51/51]
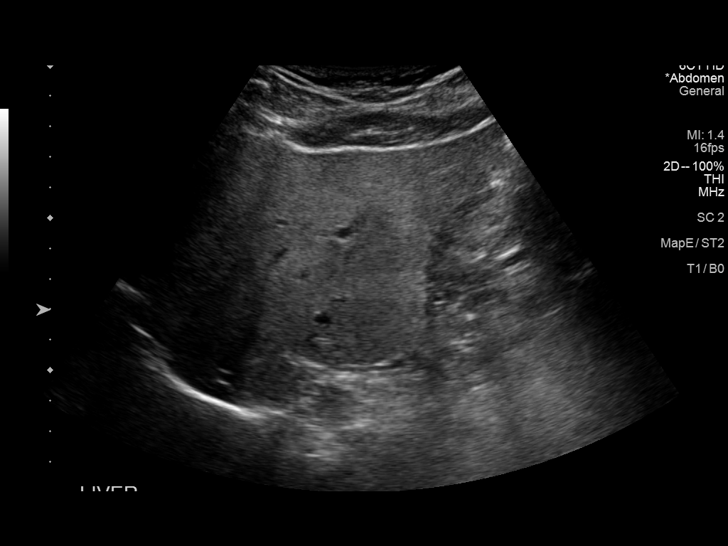

[13 of 25 positions shown; findings below may reference images not displayed]

FINDINGS: Gallbladder:

No gallstones or wall thickening visualized. No sonographic Murphy
sign noted by sonographer.

Common bile duct:

Diameter: 3.4 mm common normal

Liver:

Slightly heterogeneous echotexture suggesting mild fatty change. At
the dome of the liver, there is an indistinct 1.3 x 2.4 x 1.4 cm
region that is less echogenic. Most likely, this represents focal
fatty sparing. Possibility of a mass lesion is not excluded however.
Particularly if the patient does not have any reason for metastatic
disease, this is more likely focal fatty sparing. No ductal
dilatation. Portal vein is patent on color Doppler imaging with
normal direction of blood flow towards the liver.

Other: None.
IMPRESSION: Slightly echogenic liver suggesting fatty change. 1.3 x 2.4 x 1.4 cm
region at the dome of the liver that most likely represents an area
of focal fatty sparing. The possibility of a mass lesion cannot be
excluded by imaging but seems unlikely. If there is clinical
concern, MRI of the liver would be suggested to resolve that
question. If clinical likelihood is sufficiently low, this could be
presumed to be benign focal sparing.

## 2020-09-01 NOTE — Telephone Encounter (Signed)
Patient called to ask about continuing to take Plavix. He has been on dual-anti platelet therapy for a month, per [facility] note - he may stop. Patient has already picked up second month of Plavix. Advised patient he could take one more month of Plavix and then just continue his ASA and statin. Patient verbalizes understanding.

## 2020-09-01 NOTE — Progress Notes (Signed)
I agree with the above plan 

## 2020-09-01 NOTE — Patient Instructions (Addendum)
Referral placed to neuro rehab physical therapy to further help with gait and balance  Continue aspirin 81 mg daily  and atorvastatin 40 mg daily for secondary stroke prevention  Continue to follow up with PCP regarding cholesterol and blood pressure management  Maintain strict control of hypertension with blood pressure goal below 130/90 and cholesterol with LDL cholesterol (bad cholesterol) goal below 70 mg/dL.       Followup in the future with me in 3 months or call earlier if needed       Thank you for coming to see [place] at [facility]. I hope we have been able to provide you high quality care today.  You may receive a patient satisfaction survey over the next few weeks. We would appreciate your feedback and comments so that we may continue to improve ourselves and the health of our patients.

## 2020-09-02 DIAGNOSIS — I63032 Cerebral infarction due to thrombosis of left carotid artery: Secondary | ICD-10-CM | POA: Diagnosis not present

## 2020-09-02 DIAGNOSIS — R531 Weakness: Secondary | ICD-10-CM | POA: Diagnosis not present

## 2020-09-07 DIAGNOSIS — I63032 Cerebral infarction due to thrombosis of left carotid artery: Secondary | ICD-10-CM | POA: Diagnosis not present

## 2020-09-07 DIAGNOSIS — R531 Weakness: Secondary | ICD-10-CM | POA: Diagnosis not present

## 2020-09-09 DIAGNOSIS — R531 Weakness: Secondary | ICD-10-CM | POA: Diagnosis not present

## 2020-09-09 DIAGNOSIS — I63032 Cerebral infarction due to thrombosis of left carotid artery: Secondary | ICD-10-CM | POA: Diagnosis not present

## 2020-09-10 ENCOUNTER — Other Ambulatory Visit: Payer: Self-pay | Admitting: Family Medicine

## 2020-09-10 DIAGNOSIS — K769 Liver disease, unspecified: Secondary | ICD-10-CM

## 2020-09-10 DIAGNOSIS — R7989 Other specified abnormal findings of blood chemistry: Secondary | ICD-10-CM

## 2020-09-14 DIAGNOSIS — I63032 Cerebral infarction due to thrombosis of left carotid artery: Secondary | ICD-10-CM | POA: Diagnosis not present

## 2020-09-14 DIAGNOSIS — R531 Weakness: Secondary | ICD-10-CM | POA: Diagnosis not present

## 2020-09-19 ENCOUNTER — Other Ambulatory Visit: Payer: Self-pay | Admitting: Vascular Surgery

## 2020-10-01 ENCOUNTER — Ambulatory Visit
Admission: RE | Admit: 2020-10-01 | Discharge: 2020-10-01 | Disposition: A | Discharge status: Home / Self Care | Payer: BC Managed Care – PPO | Source: Ambulatory Visit | Attending: Family Medicine | Admitting: Family Medicine

## 2020-10-01 DIAGNOSIS — R7989 Other specified abnormal findings of blood chemistry: Secondary | ICD-10-CM

## 2020-10-01 DIAGNOSIS — K828 Other specified diseases of gallbladder: Secondary | ICD-10-CM | POA: Diagnosis not present

## 2020-10-01 DIAGNOSIS — K760 Fatty (change of) liver, not elsewhere classified: Secondary | ICD-10-CM | POA: Diagnosis not present

## 2020-10-01 DIAGNOSIS — K769 Liver disease, unspecified: Secondary | ICD-10-CM

## 2020-10-01 DIAGNOSIS — D1803 Hemangioma of intra-abdominal structures: Secondary | ICD-10-CM | POA: Diagnosis not present

## 2020-10-01 IMAGING — MR MR ABDOMEN WO/W CM
13 of 17 series · 30 of 48 positions shown · IV contrast (multihance)
Comparison: Abdominal ultrasound [DATE]

CLINICAL DATA: Further characterization of hepatic lesions seen on
prior abdominal ultrasound

EXAM:
MRI ABDOMEN WITHOUT AND WITH CONTRAST
TECHNIQUE: Multiplanar multisequence MR imaging of the abdomen was performed
both before and after the administration of intravenous contrast.
CONTRAST:  15mL MULTIHANCE GADOBENATE DIMEGLUMINE 529 MG/ML IV SOLN

[Series 3: cor haste · coronal · 5.0mm · 0.68mm/px · 1 of 34 slices shown]
[im 1/34]
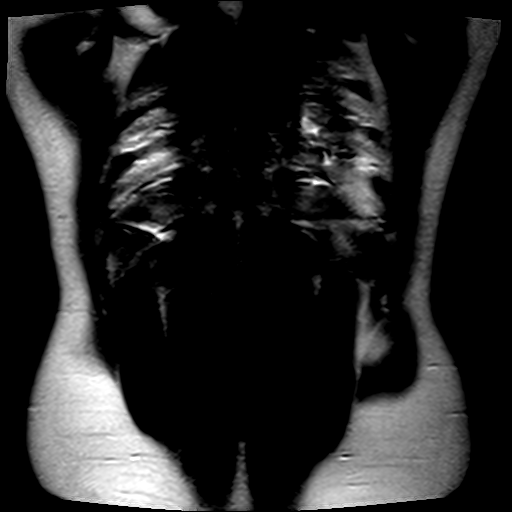

[Series 4: axial haste · axial · 6.0mm · 0.68mm/px · 1 of 38 slices shown]
[im 1/38]
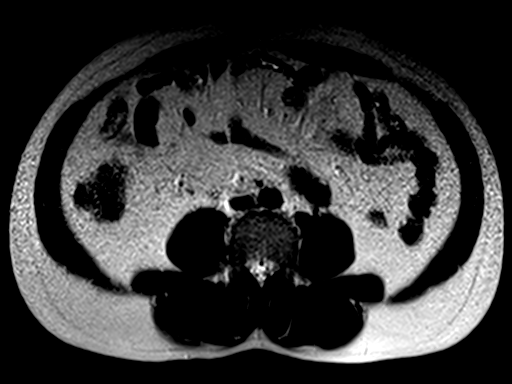

[Series 5: T1 · axial · 6.0mm · 0.68mm/px · z∈[-186,+58]mm · 2 of 76 slices shown]
[im 1/76]
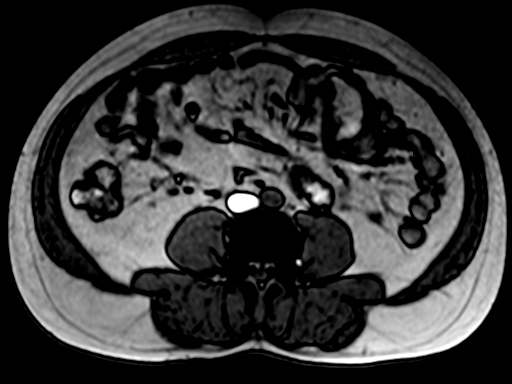
[im 76/76]
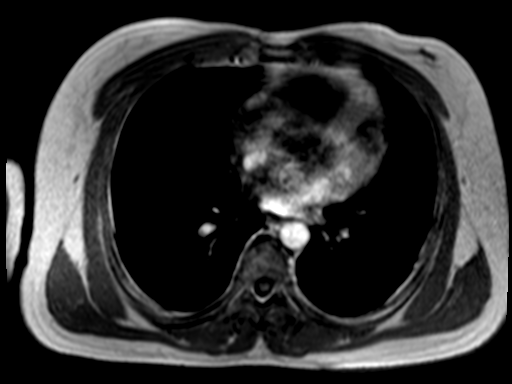

[Series 6: bSSFP · axial · 4.0mm · 0.68mm/px · 1 of 63 slices shown]
[im 1/63]
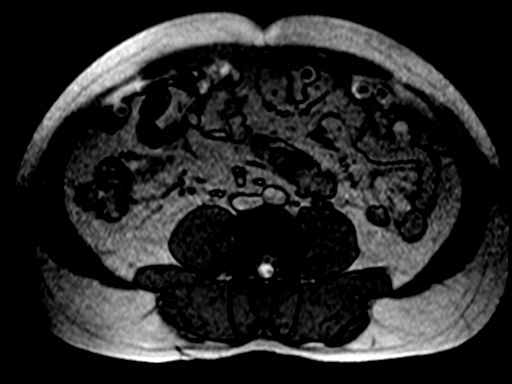

[Series 7: T2 · axial · 6.0mm · 1.09mm/px · 1 of 38 slices shown]
[im 1/38]
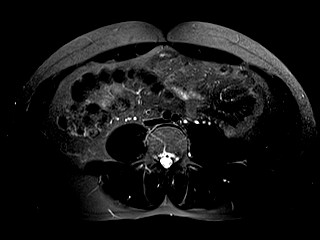

[Series 8: [id number]_trig · axial · 6.0mm · 1.82mm/px · z∈[-196,+84]mm · 3 of 120 slices shown]
[im 1/120]
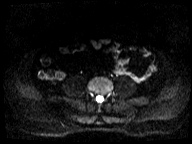
[im 60/120]
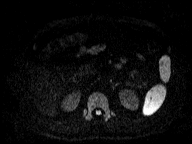
[im 120/120]
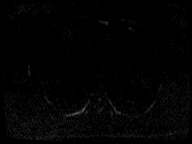

[Series 9: [id number]_trig_adc · axial · 6.0mm · 1.82mm/px · 1 of 40 slices shown]
[im 1/40]
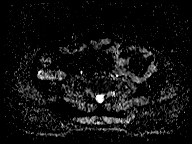

[Series 10: T1 dynamic · axial · non-contrast · 2.5mm · 0.74mm/px · z∈[-194,+64]mm · 3 of 104 slices shown]
[im 1/104]
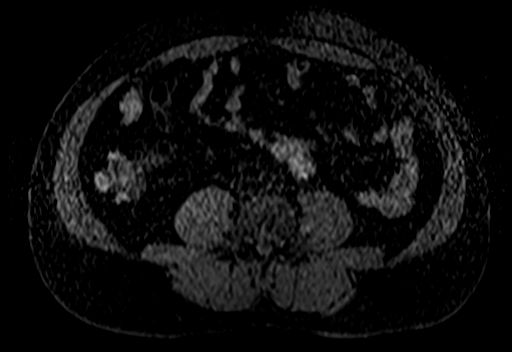
[im 52/104]
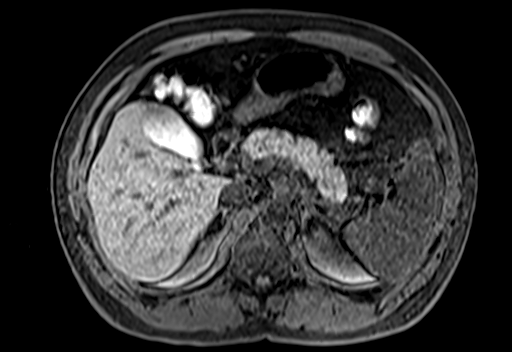
[im 104/104]
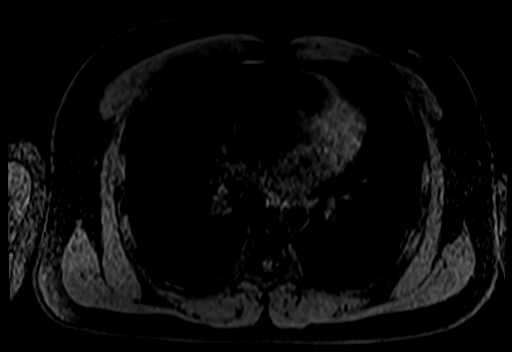

[Series 11: T1 dynamic post-contrast · axial · 2.5mm · 0.74mm/px · z∈[-194,+64]mm · 4 of 104 slices shown (1 of 5)]
[im 1/104]
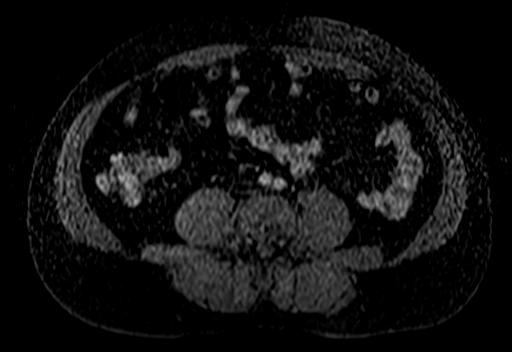
[im 35/104]
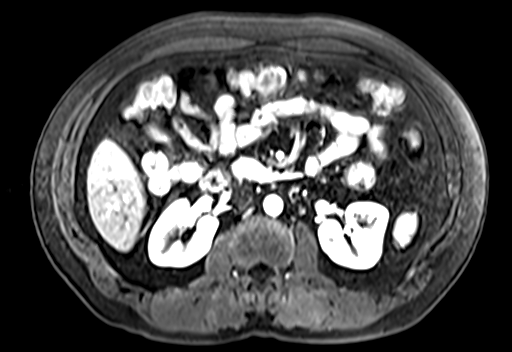
[im 69/104]
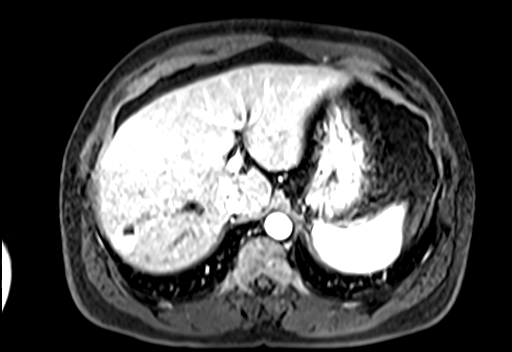
[im 104/104]
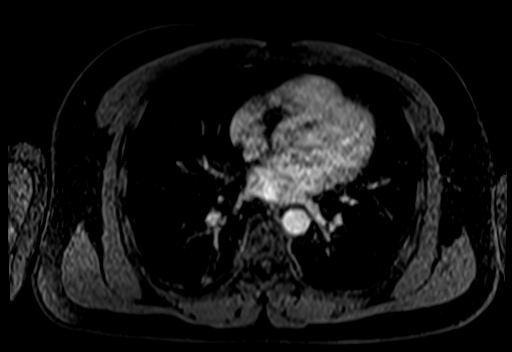

[Series 12: T1 dynamic post-contrast · axial · 2.5mm · 0.74mm/px · z∈[-194,+64]mm · 4 of 104 slices shown (2 of 5)]
[im 1/104]
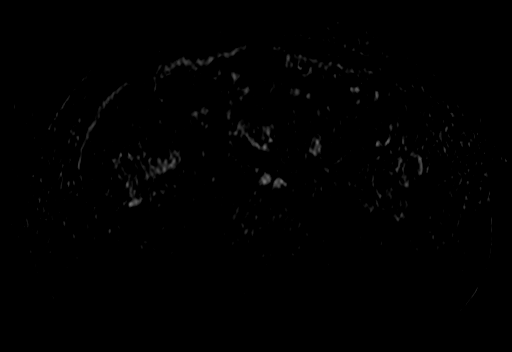
[im 35/104]
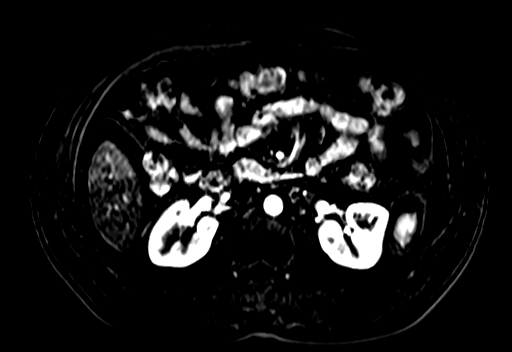
[im 69/104]
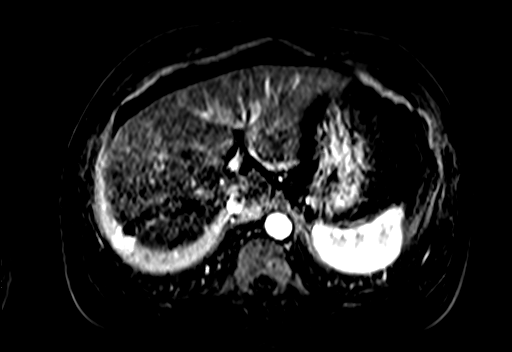
[im 104/104]
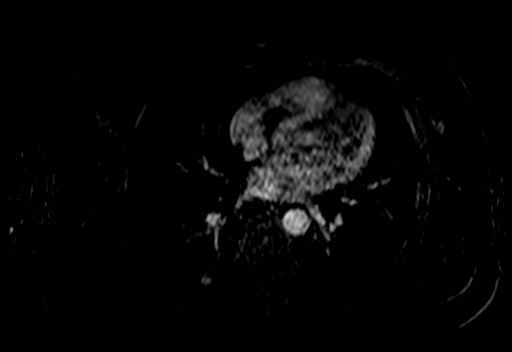

[Series 13: T1 dynamic post-contrast · axial · 2.5mm · 0.74mm/px · z∈[-194,+64]mm · 4 of 104 slices shown (3 of 5)]
[im 1/104]
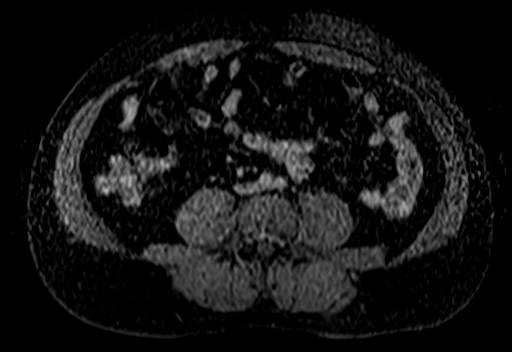
[im 35/104]
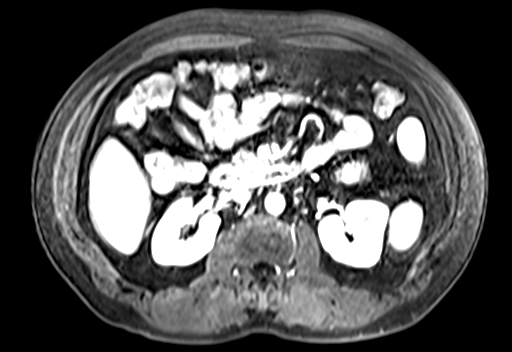
[im 69/104]
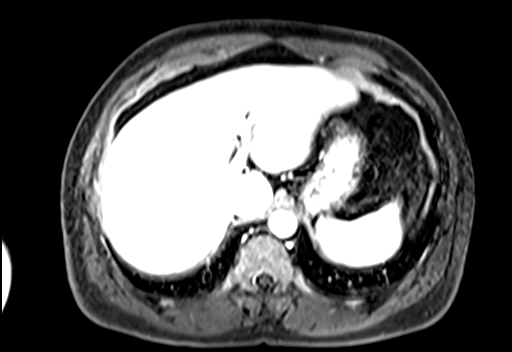
[im 104/104]
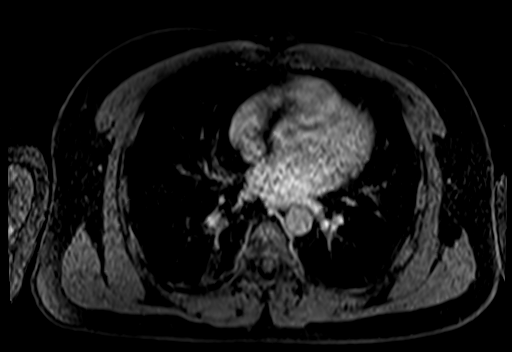

[Series 14: T1 dynamic post-contrast · axial · 2.5mm · 0.74mm/px · z∈[-194,+64]mm · 4 of 104 slices shown (4 of 5)]
[im 1/104]
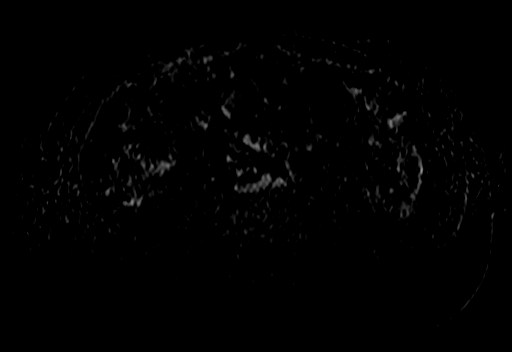
[im 35/104]
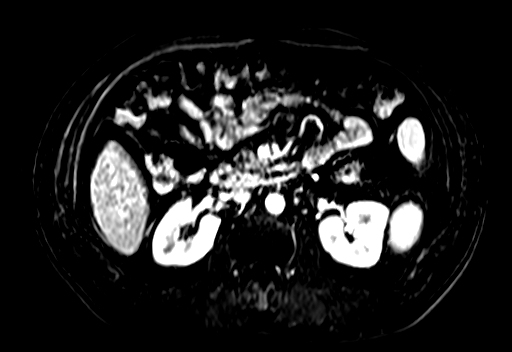
[im 69/104]
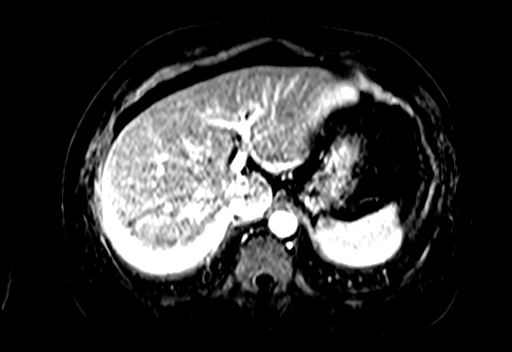
[im 104/104]
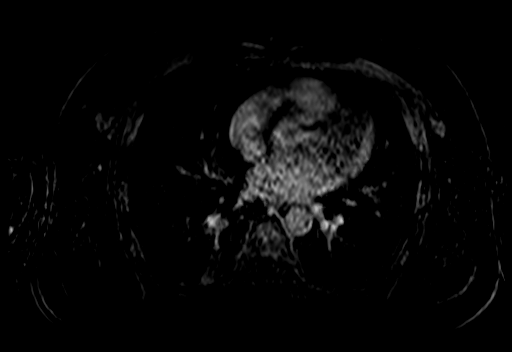

[Series 15: T1 dynamic post-contrast · axial · 2.5mm · 0.74mm/px · 1 of 104 slices shown (5 of 5)]
[im 1/104]
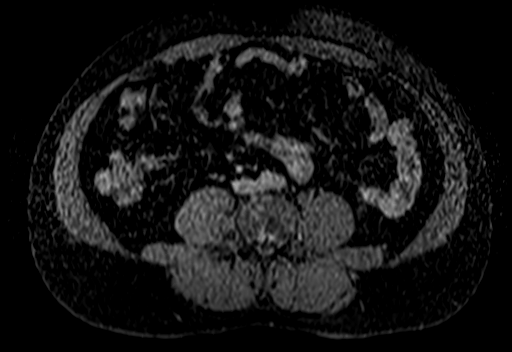

[30 of 48 positions shown; findings below may reference images not displayed]

FINDINGS: Lower chest: No acute abnormality. Normal size heart. No pericardial
effusion.

Hepatobiliary: Mild diffuse hepatic steatosis. Within the peripheral
aspect of segment VII there is a cystic 1.6 cm lesion on image [DATE]
which demonstrates peripheral nodular discontinuous enhancement
filling in on delayed imaging, corresponding with the lesion seen on
prior ultrasound. Gallbladder is distended, commonly seen in a
fasting state. No biliary ductal dilatation.

Pancreas: No mass, inflammatory changes, or other parenchymal
abnormality identified.

Spleen:  Unremarkable

Adrenals/Urinary Tract: Bilateral adrenal glands are unremarkable.
No hydronephrosis. No suspicious renal masses.

Stomach/Bowel: Visualized portions within the abdomen are
unremarkable.

Vascular/Lymphatic: No pathologically enlarged lymph nodes
identified. No abdominal aortic aneurysm demonstrated.

Other:  None.

Musculoskeletal: No suspicious bone lesions identified.
IMPRESSION: 1. Benign segment VII hepatic hemangioma, corresponding with the
lesion seen on prior ultrasound.
2. Mild diffuse hepatic steatosis.
3. Gallbladder is distended, commonly seen in a fasting state.
Recommend correlation with right upper quadrant tenderness.

## 2020-10-01 MED ORDER — GADOBENATE DIMEGLUMINE 529 MG/ML IV SOLN
15.0000 mL | Freq: Once | INTRAVENOUS | Status: AC | PRN
  Administered 2020-10-01: 15 mL via INTRAVENOUS

## 2020-10-16 DIAGNOSIS — R945 Abnormal results of liver function studies: Secondary | ICD-10-CM | POA: Diagnosis not present

## 2020-10-16 DIAGNOSIS — Z13220 Encounter for screening for lipoid disorders: Secondary | ICD-10-CM | POA: Diagnosis not present

## 2020-10-16 DIAGNOSIS — D696 Thrombocytopenia, unspecified: Secondary | ICD-10-CM | POA: Diagnosis not present

## 2020-10-16 DIAGNOSIS — D509 Iron deficiency anemia, unspecified: Secondary | ICD-10-CM | POA: Diagnosis not present

## 2020-10-20 DIAGNOSIS — Z23 Encounter for immunization: Secondary | ICD-10-CM | POA: Diagnosis not present

## 2020-10-20 DIAGNOSIS — Z0000 Encounter for general adult medical examination without abnormal findings: Secondary | ICD-10-CM | POA: Diagnosis not present

## 2020-12-02 ENCOUNTER — Ambulatory Visit: Payer: BC Managed Care – PPO | Admitting: Adult Health

## 2020-12-02 ENCOUNTER — Encounter: Payer: Self-pay | Admitting: Adult Health

## 2020-12-02 VITALS — BP 137/89 | HR 72 | Ht 62.0 in | Wt 159.0 lb

## 2020-12-02 DIAGNOSIS — I6381 Other cerebral infarction due to occlusion or stenosis of small artery: Secondary | ICD-10-CM | POA: Diagnosis not present

## 2020-12-02 DIAGNOSIS — E785 Hyperlipidemia, unspecified: Secondary | ICD-10-CM

## 2020-12-02 DIAGNOSIS — I6522 Occlusion and stenosis of left carotid artery: Secondary | ICD-10-CM | POA: Diagnosis not present

## 2020-12-02 NOTE — Progress Notes (Signed)
[facility] [address] [place]. [place] [address] [phone]       STROKE FOLLOW UP NOTE  [name] Date of Birth:  1980/08/29 Medical Record Number:  [id number]   Reason for Referral: stroke follow up    SUBJECTIVE:   CHIEF COMPLAINT:  Chief Complaint  Patient presents with  . Follow-up    TR alone Pt is getting better. Still some weakness on R side, R hand grip isn't 100% but better. Has some discomfort on L side of chest below surgery site, above heart.     HPI:   Today, 12/02/2020, Frank Cummings returns for 9-month stroke follow-up unaccompanied  Doing well since prior visit with mild residual right hand weakness but otherwise no residual deficits.  He denies new or worsening stroke/TIA symptoms  Reports compliance on aspirin and atorvastatin without associated side effects Blood pressure today 137/89 -he does not routinely monitor at home  He does report occasional discomfort near his left CEA site especially when laying down on his left side or with increased pressure.  Describes as pins/needles sensation and can be sensitive to touch at times  No further concerns at this time   History provided for reference purposes only Update 09/01/2020 JM:, Frank Cummings is being seen for hospital follow-up unaccompanied.  He has been slowly recovering since discharge but reports continued gait impairment and altered right-sided sensory.  He has not done any type of therapy since discharge but is interested in PT.  He has returned back to all prior activities including working.  Denies new or worsening stroke/TIA symptoms.  Underwent left carotid endarterectomy on 07/29/2020 by Dr. Chestine Spore without complication.  Plans on repeating carotid duplex 9 months post procedure with Dr. Chestine Spore.  He completed recommended 1 month DAPT course and remains on aspirin alone without bleeding or bruising.  Remains on atorvastatin 40 mg daily without myalgias.  Blood pressure today  satisfactory at 121/85.  No further concerns at this time.  Stroke admission 07/15/2020 [name] is a 40 y.o. male with history of Bell's palsy in 2017 (resolved) who presented to [facility] ED on 07/15/2020 with right-sided numbness.  Personally reviewed hospitalization pertinent progress notes, lab update 5 and imaging with summary provided.  Evaluated by Dr. 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  Recommended holding off on any type of surgical procedure until other etiologies ruled out.  TEE showed very trivial PFO felt to not be of clinical significance and LE Dopplers negative for DVT.  Hypercoagulable labs pending at discharge.  Recommended DAPT for 3 weeks followed by aspirin alone.  LDL 135 and initiate atorvastatin 40 mg daily.  No prior history of HTN or DM with A1c 5.3.  No prior stroke history.  Evaluated by the cardiologist visit she received CPR seen and recommended discharge home with outpatient PT/OT.  Stroke:  Left subcortical stroke, given size, location and abnormal LICA web, this is likely embolic (cardioemboic vs large vessle embolic etiology from possible LICA web abnormality under investigation Code Stroke CT head No acute abnormality. ASPECTS 10.     CTA head & neck:  No large vessel occlusion or hemodynamically significant proximal stenosis in the head or neck. 2. 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 This finding has been reported as having an increased risk of ischemic stroke, thus we will check a CUS  to better evaluate for this  MRI:  2 cm curvilinear acute ischemic perforator type infarct extending from the posterior left lentiform nucleus towards the left corona radiata.   Carotid Doppler no significant bilateral extracranial stenosis.  2D Echo w/Bubble: Slight inferior wall hypokinesis.   Ejection fraction 60 to 65%.  LE muscle no significant next is my dictation small and he does take his mind this for you just rolling is a blood is cleared medically constantly is like it is such a big difference MSSA its: Negative for DVT recent long car ride noted, but no current DVT complaints  TEE: EF 60 to 65%; very trivial PFO felt to not be of clinical significance  TCD: negative   LDL 135  HgbA1c 5.3  none prior to admission, now on ASA 81mg  + Plavix 75mg  PO daily x3 weeks, then ASA monotherapy  Therapy recommendations:  OP PT/OT  Disposition:  home     ROS:   14 system review of systems performed and negative with exception of those listed in HPI  PMH:  Past Medical History:  Diagnosis Date  . Acute ischemic stroke (HCC) 07/15/2020  . Arthritis    gout  . Bell's palsy   . Depression   . Elevated ALT measurement 07/17/2020  . Headache   . High cholesterol   . Hypertriglyceridemia 07/16/2020  . Thrombocytopenia (HCC) 07/17/2020    PSH:  Past Surgical History:  Procedure Laterality Date  . ENDARTERECTOMY Left 07/29/2020   Procedure: ENDARTERECTOMY CAROTID LEFT;  Surgeon: 14/05/2020, MD;  Location: [facility] OR;  Service: Vascular;  Laterality: Left;  . TEE WITHOUT CARDIOVERSION N/A 07/17/2020   Procedure: TRANSESOPHAGEAL ECHOCARDIOGRAM (TEE);  Surgeon: [facility], MD;  Location: [facility] ENDOSCOPY;  Service: Cardiovascular;  Laterality: N/A;    Social History:  Social History   Socioeconomic History  . Marital status: Single    Spouse name: Not on file  . Number of children: Not on file  . Years of education: Not on file  . Highest education level: Not on file  Occupational History  . Not on file  Tobacco Use  . Smoking status: Former Smoker    Quit date: 07/15/2020    Years since quitting: 0.3  . Smokeless tobacco: Never Used  Substance and Sexual Activity  . Alcohol use: Yes    Comment: occasional  . Drug use: Never  . Sexual activity:  Not Currently  Other Topics Concern  . Not on file  Social History Narrative  . Not on file   Social Determinants of Health   Financial Resource Strain: Not on file  Food Insecurity: Not on file  Transportation Needs: Not on file  Physical Activity: Not on file  Stress: Not on file  Social Connections: Not on file  Intimate Partner Violence: Not on file    Family History:  Family History  Problem Relation Age of Onset  . Cancer Mother   . Hypertension Father     Medications:   Current Outpatient Medications on File Prior to Visit  Medication Sig Dispense Refill  . aspirin EC 81 MG EC tablet Take 1 tablet (81 mg total) by mouth daily. Swallow whole.    [facility] atorvastatin (LIPITOR) 40 MG tablet Take 1 tablet (40 mg total) by mouth daily. 30 tablet 2   No current facility-administered medications on file prior to visit.    Allergies:  No Known Allergies    OBJECTIVE:  Physical Exam  Vitals:   12/02/20 1505  BP: 137/89  Pulse: 72  Weight: 159 lb (72.1 kg)  Height: 5\' 2"  (1.575 m)   Body mass index is 29.08 kg/m. No exam data present   General: well developed, well nourished,  very pleasant middle-aged Asian male, seated, in no evident distress Head: head normocephalic and atraumatic.   Neck: supple with no carotid or supraclavicular bruits Cardiovascular: regular rate and rhythm, no murmurs Musculoskeletal: no deformity Skin:  no rash/petichiae; s/p L CEA surgical incision completely healed Vascular:  Normal pulses all extremities   Neurologic Exam Mental Status: Awake and fully alert. Fluent speech and language. Oriented to place and time. Recent and remote memory intact. Attention span, concentration and fund of knowledge appropriate. Mood and affect appropriate.  Cranial Nerves: Pupils equal, briskly reactive to light. Extraocular movements full without nystagmus. Visual fields full to confrontation. Hearing intact. Facial sensation intact. Face, tongue,  palate moves normally and symmetrically.  Motor: Normal bulk and tone. Normal strength in all tested extremity muscles Sensory.: intact to touch , pinprick , position and vibratory sensation Coordination: Rapid alternating movements normal in all extremities. Finger-to-nose and heel-to-shin performed accurately bilaterally. Gait and Station: Arises from chair without difficulty. Stance is normal. Gait demonstrates  normal stride length and balance without assistive device.  Able to tandem walk and heel toe without difficulty Reflexes: 1+ and symmetric. Toes downgoing.       ASSESSMENT: Cadence Haslam is a 40 y.o. 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 Vascular risk factors include HLD.      PLAN:  1. L BG stroke, cryptogenic:  a. Residual subjective right hand weakness but overall greatly improved b. Continue aspirin 81 mg daily  and atorvastatin 40 mg daily for secondary stroke prevention.   c. Discussed secondary stroke prevention measures and importance of close PCP follow up for aggressive stroke risk factor management  2. 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 07/31/2020 without complication.  Plans on repeating carotid duplex 9 months post procedure per Dr. Chestine Spore recommendations. Reports occasional paresthesia type sensation around incision site - advised him this is typically normal as the nerves are healing but if symptoms worsen or become more consistent, to contact Dr. Ophelia Charter for possible need of further evaluation 3. HLD: LDL goal <70.  On atorvastatin 40 mg daily managed by PCP.  Reports lipid panel completed since prior visit which was satisfactory -unable to view via epic    Follow up in 6 months or call earlier if needed   CC:  [facility] provider: Dr. Chestine Spore, Dannielle Karvonen, MD    I spent 33  minutes of face-to-face and non-face-to-face time with patient.  This included education regarding his prior stroke and residual deficits with specific exercises, continuation of current medication and managing stroke risk factors, L CEA incision symptoms and indication for further evaluation with Dr. Clifton Custard and answered all other questions to patient satisfaction   Chestine Spore, [facility]  [facility] [address] [place], [place]  Phone [phone] Fax [phone] Note: This document was prepared with digital dictation and possible smart phrase technology. Any transcriptional errors that result from this process are unintentional.

## 2020-12-02 NOTE — Patient Instructions (Signed)
Continue aspirin 81 mg daily  and atorvastatin  for secondary stroke prevention  Continue to follow up with PCP regarding cholesterol management  Maintain strict control of cholesterol with LDL cholesterol (bad cholesterol) goal below 70 mg/dL.       Followup in the future with me in 6 months or call earlier if needed       Thank you for coming to see [place] at [facility]. I hope we have been able to provide you high quality care today.  You may receive a patient satisfaction survey over the next few weeks. We would appreciate your feedback and comments so that we may continue to improve ourselves and the health of our patients.

## 2020-12-03 NOTE — Progress Notes (Signed)
I agree with the above plan 

## 2020-12-21 ENCOUNTER — Telehealth: Payer: Self-pay | Admitting: Hematology and Oncology

## 2020-12-21 NOTE — Telephone Encounter (Signed)
Received a new hem referral from dr. Dr. Farris Has for thrombocytopenia. Mr. Roblero has been cld and scheduled to see Dr. Al Pimple on 5/18 at 9am. Pt aware to arrive 20 minutes early.

## 2020-12-23 ENCOUNTER — Encounter: Payer: Self-pay | Admitting: Hematology and Oncology

## 2020-12-23 ENCOUNTER — Inpatient Hospital Stay: Payer: BC Managed Care – PPO

## 2020-12-23 ENCOUNTER — Telehealth: Payer: Self-pay | Admitting: Hematology and Oncology

## 2020-12-23 ENCOUNTER — Other Ambulatory Visit: Payer: Self-pay

## 2020-12-23 ENCOUNTER — Inpatient Hospital Stay: Payer: BC Managed Care – PPO | Attending: Hematology and Oncology | Admitting: Hematology and Oncology

## 2020-12-23 VITALS — BP 124/94 | HR 70 | Temp 97.8°F | Resp 17 | Ht 62.0 in | Wt 156.8 lb

## 2020-12-23 DIAGNOSIS — Z87891 Personal history of nicotine dependence: Secondary | ICD-10-CM | POA: Insufficient documentation

## 2020-12-23 DIAGNOSIS — I639 Cerebral infarction, unspecified: Secondary | ICD-10-CM

## 2020-12-23 DIAGNOSIS — R200 Anesthesia of skin: Secondary | ICD-10-CM | POA: Diagnosis not present

## 2020-12-23 DIAGNOSIS — R519 Headache, unspecified: Secondary | ICD-10-CM | POA: Diagnosis not present

## 2020-12-23 DIAGNOSIS — M1990 Unspecified osteoarthritis, unspecified site: Secondary | ICD-10-CM | POA: Diagnosis not present

## 2020-12-23 DIAGNOSIS — E785 Hyperlipidemia, unspecified: Secondary | ICD-10-CM | POA: Diagnosis not present

## 2020-12-23 DIAGNOSIS — M109 Gout, unspecified: Secondary | ICD-10-CM | POA: Insufficient documentation

## 2020-12-23 DIAGNOSIS — Z7982 Long term (current) use of aspirin: Secondary | ICD-10-CM | POA: Diagnosis not present

## 2020-12-23 DIAGNOSIS — E781 Pure hyperglyceridemia: Secondary | ICD-10-CM | POA: Diagnosis not present

## 2020-12-23 DIAGNOSIS — E7800 Pure hypercholesterolemia, unspecified: Secondary | ICD-10-CM | POA: Diagnosis not present

## 2020-12-23 DIAGNOSIS — D696 Thrombocytopenia, unspecified: Secondary | ICD-10-CM | POA: Insufficient documentation

## 2020-12-23 DIAGNOSIS — Z79899 Other long term (current) drug therapy: Secondary | ICD-10-CM | POA: Diagnosis not present

## 2020-12-23 LAB — IRON AND TIBC
Iron: 112 ug/dL (ref 42–163)
Saturation Ratios: 37 % (ref 20–55)
TIBC: 299 ug/dL (ref 202–409)
UIBC: 187 ug/dL (ref 117–376)

## 2020-12-23 LAB — CBC WITH DIFFERENTIAL/PLATELET
Abs Immature Granulocytes: 0.01 10*3/uL (ref 0.00–0.07)
Basophils Absolute: 0 10*3/uL (ref 0.0–0.1)
Basophils Relative: 1 %
Eosinophils Absolute: 0.2 10*3/uL (ref 0.0–0.5)
Eosinophils Relative: 3 %
HCT: 46.1 % (ref 39.0–52.0)
Hemoglobin: 15.6 g/dL (ref 13.0–17.0)
Immature Granulocytes: 0 %
Lymphocytes Relative: 27 %
Lymphs Abs: 1.7 10*3/uL (ref 0.7–4.0)
MCH: 27.1 pg (ref 26.0–34.0)
MCHC: 33.8 g/dL (ref 30.0–36.0)
MCV: 80 fL (ref 80.0–100.0)
Monocytes Absolute: 0.3 10*3/uL (ref 0.1–1.0)
Monocytes Relative: 5 %
Neutro Abs: 4 10*3/uL (ref 1.7–7.7)
Neutrophils Relative %: 64 %
Platelets: 99 10*3/uL — ABNORMAL LOW (ref 150–400)
RBC: 5.76 MIL/uL (ref 4.22–5.81)
RDW: 14.3 % (ref 11.5–15.5)
WBC: 6.2 10*3/uL (ref 4.0–10.5)
nRBC: 0 % (ref 0.0–0.2)

## 2020-12-23 LAB — CMP (CANCER CENTER ONLY)
ALT: 46 U/L — ABNORMAL HIGH (ref 0–44)
AST: 25 U/L (ref 15–41)
Albumin: 4.3 g/dL (ref 3.5–5.0)
Alkaline Phosphatase: 119 U/L (ref 38–126)
Anion gap: 10 (ref 5–15)
BUN: 12 mg/dL (ref 6–20)
CO2: 28 mmol/L (ref 22–32)
Calcium: 9.6 mg/dL (ref 8.9–10.3)
Chloride: 104 mmol/L (ref 98–111)
Creatinine: 0.93 mg/dL (ref 0.61–1.24)
GFR, Estimated: >60 mL/min (ref >60)
Glucose, Bld: 99 mg/dL (ref 70–99)
Potassium: 4 mmol/L (ref 3.5–5.1)
Sodium: 142 mmol/L (ref 135–145)
Total Bilirubin: 0.8 mg/dL (ref 0.3–1.2)
Total Protein: 7.1 g/dL (ref 6.5–8.1)

## 2020-12-23 LAB — HEPATITIS PANEL, ACUTE
HCV Ab: NONREACTIVE
Hep A IgM: NONREACTIVE
Hep B C IgM: NONREACTIVE
Hepatitis B Surface Ag: NONREACTIVE

## 2020-12-23 LAB — RETICULOCYTES
Immature Retic Fract: 12.7 % (ref 2.3–15.9)
RBC.: 5.71 MIL/uL (ref 4.22–5.81)
Retic Count, Absolute: 84.5 10*3/uL (ref 19.0–186.0)
Retic Ct Pct: 1.5 % (ref 0.4–3.1)

## 2020-12-23 LAB — LACTATE DEHYDROGENASE: LDH: 164 U/L (ref 98–192)

## 2020-12-23 LAB — PATHOLOGIST SMEAR REVIEW

## 2020-12-23 LAB — APTT: aPTT: 30 seconds (ref 24–36)

## 2020-12-23 LAB — PROTIME-INR
INR: 1 (ref 0.8–1.2)
Prothrombin Time: 13.2 seconds (ref 11.4–15.2)

## 2020-12-23 LAB — VITAMIN B12: Vitamin B-12: 207 pg/mL (ref 180–914)

## 2020-12-23 NOTE — Telephone Encounter (Signed)
Scheduled follow-up appointment per 5/18 los. Patient is aware. 

## 2020-12-23 NOTE — Progress Notes (Signed)
[facility] CONSULT NOTE  Patient Care Team: Farris Has, MD as PCP - General (Family Medicine)  CHIEF COMPLAINTS/PURPOSE OF CONSULTATION:  Thrombocytopenia.  ASSESSMENT & PLAN:   Thrombocytopenia (HCC) This is a very pleasant 40 year old male patient with past medical history significant for acute ischemic stroke, carotid stenosis, hypertriglyceridemia, dyslipidemia referred to hematology for evaluation of thrombocytopenia.  Patient arrived to the appointment today by himself.  He denies any health complaints except for his recent stroke, recent carotid endarterectomy. Physical examination is unremarkable, no palpable lymphadenopathy or hepatosplenomegaly. I reviewed the following details about thrombocytopenia. Thrombocytopenia is defined as a platelet count below the lower limit of normal (ie, <150,000/microL [150 x 109/L] for adults).  Degrees of thrombocytopenia can be further subdivided into mild (platelet count 100,000 to 150,000/microL), moderate (50,000 to 99,000/microL), and severe (<50,000/microL) Most common causes of thrombocytopenia include but not limited to chronic liver disease or hypersplenism, immune thrombocytopenia, viral infections such as Hepatitis, HIV, active bacterial infections, autoimmune diseases, alcohol, nutritional deficiencies and medications.  Rarely bone marrow disorders such as myelodysplatic syndrome, bone marrow failure syndromes, acute leukemia and PNH can present with thrombocytopenia. Additional rare causes of thrombocytopenia include vascular conditions associated with platelet destruction (eg, giant capillary hemangioma, large aortic aneurysms, cardiopulmonary bypass, intraaortic balloon pumps. We will proceed with lab evaluation today, he will return to clinic in 4 weeks.  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 All his questions were answered to the best of my knowledge.  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 Thank you for consulting [place] in the care of this patient.  Please not hesitate to contact us with any additional questions or concerns.  Orders Placed This Encounter  Procedures  . CBC with Differential/Platelet    Standing Status:   Standing    Number of Occurrences:   22    Standing Expiration Date:   12/23/2021  . CMP (Cancer Center only)    Standing Status:   Future    Number of Occurrences:   1    Standing Expiration Date:   12/23/2021  . Iron and TIBC    Standing Status:   Future    Number of Occurrences:   1    Standing Expiration Date:   12/23/2021  . Vitamin B12    Standing Status:   Future    Number of Occurrences:   1    Standing Expiration Date:   12/23/2021  . Folate RBC    Standing Status:   Future    Number of Occurrences:   1    Standing Expiration Date:   12/23/2021  . Lactate dehydrogenase    Standing Status:   Future    Number of Occurrences:   1    Standing Expiration Date:   12/23/2021  . Reticulocytes    Standing Status:   Future    Number of Occurrences:   1    Standing Expiration Date:   12/23/2021  . APTT    Standing Status:   Future    Number of Occurrences:   1    Standing Expiration Date:   12/23/2021  . Protime-INR    Standing Status:   Future    Number of Occurrences:   1    Standing Expiration Date:   12/23/2021  .  Pathologist smear review    Standing Status:   Future    Number of Occurrences:   1    Standing Expiration Date:   12/23/2021  . Hepatitis panel, acute    Standing Status:   Future    Number of Occurrences:   1    Standing Expiration Date:   12/23/2021  . HIV 1 RNA quant-no reflex-blood    Standing Status:   Future    Number of Occurrences:   1    Standing Expiration Date:   12/23/2021  . Ambulatory referral to Social Work    Referral  Priority:   Routine    Referral Type:   Consultation    Referral Reason:   Specialty Services Required    Number of Visits Requested:   1    HISTORY OF PRESENTING ILLNESS:   Frank Cummings 40 y.o. male is here because of Thrombocytopenia.  This is a very pleasant 40 year old male patient with past medical history significant for acute ischemic stroke, Bell's palsy, gouty arthritis, high cholesterol, chronic thrombocytopenia who has recently moved from [place] to [place] and is being referred to hematology for follow-up and recommendations. Patient denies any new health complaints.  He has had an acute ischemic stroke thought to be related to carotid stenosis, also had a carotid endarterectomy at the same time.  He denies any other DVT/PE personally nor any family history. No fevers, drenching night sweats, loss of appetite or loss of weight.  Headaches have improved tremendously since his stroke.  No bleeding complaints.  No change in breathing, bowel habits or urinary habits.  He does complain of some gouty arthritis in his right big toe and has an exacerbation once a year.   No known nutritional diseases, alcohol intake, viral infections, autoimmune diseases. Rest of the pertinent 10 point ROS reviewed and negative.  REVIEW OF SYSTEMS:   Constitutional: Denies fevers, chills or abnormal night sweats Eyes: Denies blurriness of vision, double vision or watery eyes Ears, nose, mouth, throat, and face: Denies mucositis or sore throat Respiratory: Denies cough, dyspnea or wheezes Cardiovascular: Denies palpitation, chest discomfort or lower extremity swelling Gastrointestinal:  Denies nausea, heartburn or change in bowel habits Skin: Denies abnormal skin rashes Lymphatics: Denies new lymphadenopathy or easy bruising Neurological:Denies numbness, tingling or new weaknesses Behavioral/Psych: Mood is stable, no new changes  All other systems were reviewed with the patient and are  negative.  MEDICAL HISTORY:  Past Medical History:  Diagnosis Date  . Acute ischemic stroke (HCC) 07/15/2020  . Arthritis    gout  . Bell's palsy   . Depression   . Elevated ALT measurement 07/17/2020  . Headache   . High cholesterol   . Hypertriglyceridemia 07/16/2020  . Thrombocytopenia (HCC) 07/17/2020    SURGICAL HISTORY: Past Surgical History:  Procedure Laterality Date  . ENDARTERECTOMY Left 07/29/2020   Procedure: ENDARTERECTOMY CAROTID LEFT;  Surgeon: Cephus Shelling, MD;  Location: [facility] OR;  Service: Vascular;  Laterality: Left;  . TEE WITHOUT CARDIOVERSION N/A 07/17/2020   Procedure: TRANSESOPHAGEAL ECHOCARDIOGRAM (TEE);  Surgeon: Dolores Patty, MD;  Location[facility] ENDOSCOPY;  Service: Cardiovascular;  Laterality: N/A;    SOCIAL HISTORY: Social History   Socioeconomic History  . Marital status: Single    Spouse name: Not on file  . Number of children: Not on file  . Years of education: Not on file  . Highest education level: Not on file  Occupational History  . Not on file  Tobacco Use  .  Smoking status: Former Smoker    Quit date: 07/15/2020    Years since quitting: 0.4  . Smokeless tobacco: Never Used  Substance and Sexual Activity  . Alcohol use: Yes    Comment: occasional  . Drug use: Never  . Sexual activity: Not Currently  Other Topics Concern  . Not on file  Social History Narrative  . Not on file   Social Determinants of Health   Financial Resource Strain: Not on file  Food Insecurity: Not on file  Transportation Needs: Not on file  Physical Activity: Not on file  Stress: Not on file  Social Connections: Not on file  Intimate Partner Violence: Not on file    FAMILY HISTORY: Family History  Problem Relation Age of Onset  . Cancer Mother   . Hypertension Father     ALLERGIES:  has No Known Allergies.  MEDICATIONS:  Current Outpatient Medications  Medication Sig Dispense Refill  . aspirin EC 81 MG EC tablet Take 1 tablet  (81 mg total) by mouth daily. Swallow whole.    Marland Kitchen atorvastatin (LIPITOR) 40 MG tablet Take 1 tablet (40 mg total) by mouth daily. 30 tablet 2   No current facility-administered medications for this visit.    PHYSICAL EXAMINATION: ECOG PERFORMANCE STATUS: 0 - Asymptomatic  Vitals:   12/23/20 0934  BP: (!) 124/94  Pulse: 70  Resp: 17  Temp: 97.8 F (36.6 C)  SpO2: 99%   Filed Weights   12/23/20 0934  Weight: 156 lb 12.8 oz (71.1 kg)   GENERAL:alert, no distress and comfortable SKIN: skin color, texture, turgor are normal, no rashes or significant lesions EYES: normal, conjunctiva are pink and non-injected, sclera clear OROPHARYNX:no exudate, no erythema and lips, buccal mucosa, and tongue normal  NECK: supple, thyroid normal size, non-tender, without nodularity LYMPH:  no palpable lymphadenopathy in the cervical, axillary or inguinal LUNGS: clear to auscultation and percussion with normal breathing effort HEART: regular rate & rhythm and no murmurs and no lower extremity edema ABDOMEN:abdomen soft, non-tender and normal bowel sounds Musculoskeletal:no cyanosis of digits and no clubbing, tophaceous deposits noted on right great toe.  PSYCH: alert & oriented x 3 with fluent speech NEURO: no focal motor/sensory deficits  LABORATORY DATA:  I have reviewed the data as listed Lab Results  Component Value Date   WBC 11.7 (H) 07/30/2020   HGB 11.1 (L) 07/30/2020   HCT 31.5 (L) 07/30/2020   MCV 83.1 07/30/2020   PLT 113 (L) 07/30/2020     Chemistry      Component Value Date/Time   NA 137 07/30/2020 0423   K 3.7 07/30/2020 0423   CL 106 07/30/2020 0423   CO2 23 07/30/2020 0423   BUN 10 07/30/2020 0423   CREATININE 0.94 07/30/2020 0423      Component Value Date/Time   CALCIUM 8.3 (L) 07/30/2020 0423   ALKPHOS 111 07/29/2020 0950   AST 41 07/29/2020 0950   ALT 101 (H) 07/29/2020 0950   BILITOT 1.2 07/29/2020 0950      I have reviewed his labs from 2010.  He has had  chronic thrombocytopenia. Most recent labs reviewed from December 2021 and this showed platelet count around 80-110,000, no abnormality in the white and red cell line.  RADIOGRAPHIC STUDIES: I have personally reviewed the radiological images as listed and agreed with the findings in the report. No results found.  All questions were answered. The patient knows to call the clinic with any problems, questions or concerns.  Rachel MouldsPraveena Waylen Depaolo, MD 12/23/2020 10:24 AM

## 2020-12-23 NOTE — Assessment & Plan Note (Signed)
This is a very pleasant 40 year old male patient with past medical history significant for acute ischemic stroke, carotid stenosis, hypertriglyceridemia, dyslipidemia referred to hematology for evaluation of thrombocytopenia.  Patient arrived to the appointment today by himself.  He denies any health complaints except for his recent stroke, recent carotid endarterectomy. Physical examination is unremarkable, no palpable lymphadenopathy or hepatosplenomegaly. I reviewed the following details about thrombocytopenia. Thrombocytopenia is defined as a platelet count below the lower limit of normal (ie, <150,000/microL [150 x 109/L] for adults).  Degrees of thrombocytopenia can be further subdivided into mild (platelet count 100,000 to 150,000/microL), moderate (50,000 to 99,000/microL), and severe (<50,000/microL) Most common causes of thrombocytopenia include but not limited to chronic liver disease or hypersplenism, immune thrombocytopenia, viral infections such as Hepatitis, HIV, active bacterial infections, autoimmune diseases, alcohol, nutritional deficiencies and medications.  Rarely bone marrow disorders such as myelodysplatic syndrome, bone marrow failure syndromes, acute leukemia and PNH can present with thrombocytopenia. Additional rare causes of thrombocytopenia include vascular conditions associated with platelet destruction (eg, giant capillary hemangioma, large aortic aneurysms, cardiopulmonary bypass, intraaortic balloon pumps. We will proceed with lab evaluation today, he will return to clinic in 4 weeks.  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 All his questions were answered to the best of my knowledge.  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 Thank you for consulting [place] in the  care of this patient.  Please not hesitate to contact us with any additional questions or concerns.

## 2020-12-24 ENCOUNTER — Encounter: Payer: Self-pay | Admitting: General Practice

## 2020-12-24 LAB — FOLATE RBC
Folate, Hemolysate: 416 ng/mL
Folate, RBC: 847 ng/mL (ref >498)
Hematocrit: 49.1 % (ref 37.5–51.0)

## 2020-12-24 LAB — HIV-1 RNA QUANT-NO REFLEX-BLD
HIV 1 RNA Quant: <20 copies/mL
LOG10 HIV-1 RNA: UNDETERMINED log10copy/mL

## 2020-12-24 NOTE — Progress Notes (Signed)
[facility] Psychosocial Distress Screening Clinical Social Work  Clinical Social Work was referred by distress screening protocol.  The patient scored a 5 on the Psychosocial Distress Thermometer which indicates moderate distress. Clinical Social Worker contacted patient by phone to assess for distress and other psychosocial needs. "Everything went well, I am doing well."  Feels fatigued, has newborn baby which is contributing.  Has two older children as well.  He had stroke in Dec 2021, is recovering.  Completed PT and OT.  It is somewhat disconcerting to have experienced a health challenge like a stroke at a comparatively young age.  He is doing is best to regain his prior level of function at home and at work.    ONCBCN DISTRESS SCREENING 12/23/2020  Screening Type Initial Screening  Distress experienced in past week (1-10) 5  Practical problem type Work/school  Family Problem type Children  Emotional problem type Nervousness/Anxiety;Adjusting to illness  Spiritual/Religous concerns type Loss of sense of purpose  Physician notified of physical symptoms Yes  Referral to clinical social work Yes    Clinical Social Worker follow up needed: No.  If yes, follow up plan:  Sallee Lange, LCSW   Santa Genera, LCSW Clinical Social Worker Phone:  [phone]

## 2021-01-18 NOTE — Assessment & Plan Note (Addendum)
This is a very pleasant 40 year old male patient with past medical history significant for acute ischemic stroke, carotid stenosis, hypertriglyceridemia, dyslipidemia referred to hematology for evaluation of thrombocytopenia. Patient had chronic thrombocytopenia at least on my review of from 2010.  His platelet count usually ranges from 80-110,000.  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  He does not have any new concerning review of systems.  Physical examination again unremarkable.  We have reviewed labs from last visit which did not show any obvious findings concerning for hemolysis or nutritional deficiencies.  Rest of the cell lines except for platelet count appear to be normal. We have discussed about continuing surveillance periodically every 3 to 4 months with repeat labs.  We have discussed about symptoms and signs to watch that can happen with severe thrombocytopenia including bleeding, epistaxis, petechial rash, intractable headaches.  He was encouraged to reach out to [place] sooner if he has any concerning symptoms or questions. He will return to clinic in about 3 to 4 months with repeat labs.  Encouraged healthy habits, maintaining normal weight and follow-up on hepatic steatosis with his primary care physician.

## 2021-01-18 NOTE — Progress Notes (Signed)
[facility] CONSULT NOTE  Patient Care Team: Farris Has, MD as PCP - General (Family Medicine)  CHIEF COMPLAINTS/PURPOSE OF CONSULTATION:  Thrombocytopenia.  ASSESSMENT & PLAN:   Thrombocytopenia (HCC) This is a very pleasant 40 year old male patient with past medical history significant for acute ischemic stroke, carotid stenosis, hypertriglyceridemia, dyslipidemia referred to hematology for evaluation of thrombocytopenia. Patient had chronic thrombocytopenia at least on my review of from 2010.  His platelet count usually ranges from 80-110,000.  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  He does not have any new concerning review of systems.  Physical examination again unremarkable.  We have reviewed labs from last visit which did not show any obvious findings concerning for hemolysis or nutritional deficiencies.  Rest of the cell lines except for platelet count appear to be normal. We have discussed about continuing surveillance periodically every 3 to 4 months with repeat labs.  We have discussed about symptoms and signs to watch that can happen with severe thrombocytopenia including bleeding, epistaxis, petechial rash, intractable headaches.  He was encouraged to reach out to [place] sooner if he has any concerning symptoms or questions. He will return to clinic in about 3 to 4 months with repeat labs.  Encouraged healthy habits, maintaining normal weight and follow-up on hepatic steatosis with his primary care physician.  Orders Placed This Encounter  Procedures   CBC with Differential/Platelet    Standing Status:   Future    Standing Expiration Date:   01/19/2022     HISTORY OF PRESENTING ILLNESS:   Frank Cummings 40 y.o. male is here because of Thrombocytopenia.  This is a very pleasant 40 year old male patient with past medical history significant for acute ischemic stroke,  Bell's palsy, gouty arthritis, high cholesterol, chronic thrombocytopenia who has recently moved from [facility] to [place] and is being referred to hematology for follow-up and recommendations.  Interval History  Patient is here for a follow-up since his initial visit.  He denies any new health complaints, doing very well.  No residual neurological deficit from the stroke.  No bleeding complaints or B symptoms.  No interim infections or hospitalizations.  No new medications.  Rest of the pertinent 10 point ROS reviewed and negative.  REVIEW OF SYSTEMS:   Constitutional: Denies fevers, chills or abnormal night sweats Eyes: Denies blurriness of vision, double vision or watery eyes Ears, nose, mouth, throat, and face: Denies mucositis or sore throat Respiratory: Denies cough, dyspnea or wheezes Cardiovascular: Denies palpitation, chest discomfort or lower extremity swelling Gastrointestinal:  Denies nausea, heartburn or change in bowel habits Skin: Denies abnormal skin rashes Lymphatics: Denies new lymphadenopathy or easy bruising Neurological:Denies numbness, tingling or new weaknesses Behavioral/Psych: Mood is stable, no new changes  All other systems were reviewed with the patient and are negative.  MEDICAL HISTORY:  Past Medical History:  Diagnosis Date   Acute ischemic stroke (HCC) 07/15/2020   Arthritis    gout   Bell's palsy    Depression    Elevated ALT measurement 07/17/2020   Headache    High cholesterol    Hypertriglyceridemia 07/16/2020   Thrombocytopenia (HCC) 07/17/2020    SURGICAL HISTORY: Past Surgical History:  Procedure Laterality Date   ENDARTERECTOMY Left 07/29/2020   Procedure: ENDARTERECTOMY CAROTID LEFT;  Surgeon: Cephus Shelling, MD;  Location: [facility] OR;  Service: Vascular;  Laterality: Left;   TEE WITHOUT CARDIOVERSION N/A 07/17/2020   Procedure:  TRANSESOPHAGEAL ECHOCARDIOGRAM (TEE);  Surgeon: Dolores Patty, MD;  Location: [facility] ENDOSCOPY;  Service:  Cardiovascular;  Laterality: N/A;    SOCIAL HISTORY: Social History   Socioeconomic History   Marital status: Single    Spouse name: Not on file   Number of children: Not on file   Years of education: Not on file   Highest education level: Not on file  Occupational History   Not on file  Tobacco Use   Smoking status: Former    Pack years: 0.00    Types: Cigarettes    Quit date: 07/15/2020    Years since quitting: 0.5   Smokeless tobacco: Never  Substance and Sexual Activity   Alcohol use: Yes    Comment: occasional   Drug use: Never   Sexual activity: Not Currently  Other Topics Concern   Not on file  Social History Narrative   Not on file   Social Determinants of Health   Financial Resource Strain: Not on file  Food Insecurity: Not on file  Transportation Needs: Not on file  Physical Activity: Not on file  Stress: Not on file  Social Connections: Not on file  Intimate Partner Violence: Not on file    FAMILY HISTORY: Family History  Problem Relation Age of Onset   Cancer Mother    Hypertension Father     ALLERGIES:  has No Known Allergies.  MEDICATIONS:  Current Outpatient Medications  Medication Sig Dispense Refill   aspirin EC 81 MG EC tablet Take 1 tablet (81 mg total) by mouth daily. Swallow whole.     atorvastatin (LIPITOR) 40 MG tablet Take 1 tablet (40 mg total) by mouth daily. 30 tablet 2   No current facility-administered medications for this visit.    PHYSICAL EXAMINATION: ECOG PERFORMANCE STATUS: 0 - Asymptomatic  Vitals:   01/19/21 0906  BP: 138/87  Pulse: 86  Resp: 17  Temp: 97.9 F (36.6 C)  SpO2: 98%    Filed Weights   01/19/21 0906  Weight: 159 lb 8 oz (72.3 kg)    Physical Exam Constitutional:      Appearance: Normal appearance.  HENT:     Head: Normocephalic.  Neck:     Vascular: No carotid bruit.  Cardiovascular:     Rate and Rhythm: Normal rate and regular rhythm.     Pulses: Normal pulses.     Heart sounds:  Normal heart sounds.  Pulmonary:     Effort: Pulmonary effort is normal.     Breath sounds: Normal breath sounds.  Abdominal:     General: Abdomen is flat. Bowel sounds are normal.     Palpations: Abdomen is soft.  Musculoskeletal:        General: Normal range of motion.     Cervical back: Normal range of motion and neck supple.  Lymphadenopathy:     Cervical: No cervical adenopathy.  Skin:    General: Skin is warm and dry.  Neurological:     General: No focal deficit present.     Mental Status: He is alert.  Psychiatric:        Mood and Affect: Mood normal.     LABORATORY DATA:  I have reviewed the data as listed Lab Results  Component Value Date   WBC 6.2 12/23/2020   HGB 15.6 12/23/2020   HCT 49.1 12/23/2020   HCT 46.1 12/23/2020   MCV 80.0 12/23/2020   PLT 99 (L) 12/23/2020     Chemistry      Component  Value Date/Time   NA 142 12/23/2020 1010   K 4.0 12/23/2020 1010   CL 104 12/23/2020 1010   CO2 28 12/23/2020 1010   BUN 12 12/23/2020 1010   CREATININE 0.93 12/23/2020 1010      Component Value Date/Time   CALCIUM 9.6 12/23/2020 1010   ALKPHOS 119 12/23/2020 1010   AST 25 12/23/2020 1010   ALT 46 (H) 12/23/2020 1010   BILITOT 0.8 12/23/2020 1010      I have reviewed his labs from 2010.  He has had chronic thrombocytopenia. Most recent labs reviewed  Platelets of 99 K, mild ALT elevation, otherwise no findings No nutritional deficiencies No hemolysis.  RADIOGRAPHIC STUDIES: I have personally reviewed the radiological images as listed and agreed with the findings in the report. No results found.  All questions were answered. The patient knows to call the clinic with any problems, questions or concerns. I spent 30 minutes in the care of this patient including H and P, review of records, counseling and coordination of care. We discussed about causes of thrombocytopenia, symptoms and signs of thrombocytopenia, surveillance recommendations, avoid NSAID's  and hepatotoxins and maintaining a healthy weight.    Rachel Moulds, MD 01/19/2021 9:26 AM

## 2021-01-19 ENCOUNTER — Other Ambulatory Visit: Payer: Self-pay

## 2021-01-19 ENCOUNTER — Inpatient Hospital Stay: Payer: BC Managed Care – PPO | Attending: Hematology and Oncology | Admitting: Hematology and Oncology

## 2021-01-19 ENCOUNTER — Encounter: Payer: Self-pay | Admitting: Hematology and Oncology

## 2021-01-19 DIAGNOSIS — Z7982 Long term (current) use of aspirin: Secondary | ICD-10-CM | POA: Insufficient documentation

## 2021-01-19 DIAGNOSIS — I6529 Occlusion and stenosis of unspecified carotid artery: Secondary | ICD-10-CM | POA: Diagnosis not present

## 2021-01-19 DIAGNOSIS — Z8673 Personal history of transient ischemic attack (TIA), and cerebral infarction without residual deficits: Secondary | ICD-10-CM | POA: Diagnosis not present

## 2021-01-19 DIAGNOSIS — D696 Thrombocytopenia, unspecified: Secondary | ICD-10-CM | POA: Diagnosis not present

## 2021-01-19 DIAGNOSIS — E785 Hyperlipidemia, unspecified: Secondary | ICD-10-CM | POA: Insufficient documentation

## 2021-01-19 DIAGNOSIS — E781 Pure hyperglyceridemia: Secondary | ICD-10-CM | POA: Insufficient documentation

## 2021-01-19 DIAGNOSIS — Z79899 Other long term (current) drug therapy: Secondary | ICD-10-CM | POA: Insufficient documentation

## 2021-04-26 DIAGNOSIS — D696 Thrombocytopenia, unspecified: Secondary | ICD-10-CM | POA: Diagnosis not present

## 2021-04-26 DIAGNOSIS — E785 Hyperlipidemia, unspecified: Secondary | ICD-10-CM | POA: Diagnosis not present

## 2021-04-26 DIAGNOSIS — R799 Abnormal finding of blood chemistry, unspecified: Secondary | ICD-10-CM | POA: Diagnosis not present

## 2021-04-26 DIAGNOSIS — R7309 Other abnormal glucose: Secondary | ICD-10-CM | POA: Diagnosis not present

## 2021-04-26 DIAGNOSIS — D509 Iron deficiency anemia, unspecified: Secondary | ICD-10-CM | POA: Diagnosis not present

## 2021-04-26 DIAGNOSIS — R945 Abnormal results of liver function studies: Secondary | ICD-10-CM | POA: Diagnosis not present

## 2021-04-26 DIAGNOSIS — M109 Gout, unspecified: Secondary | ICD-10-CM | POA: Diagnosis not present

## 2021-04-29 DIAGNOSIS — D696 Thrombocytopenia, unspecified: Secondary | ICD-10-CM | POA: Diagnosis not present

## 2021-04-29 DIAGNOSIS — I779 Disorder of arteries and arterioles, unspecified: Secondary | ICD-10-CM | POA: Diagnosis not present

## 2021-04-29 DIAGNOSIS — I6930 Unspecified sequelae of cerebral infarction: Secondary | ICD-10-CM | POA: Diagnosis not present

## 2021-04-29 DIAGNOSIS — E785 Hyperlipidemia, unspecified: Secondary | ICD-10-CM | POA: Diagnosis not present

## 2021-05-11 ENCOUNTER — Other Ambulatory Visit: Payer: Self-pay

## 2021-05-11 ENCOUNTER — Ambulatory Visit (HOSPITAL_COMMUNITY)
Admission: RE | Admit: 2021-05-11 | Discharge: 2021-05-11 | Disposition: A | Discharge status: Home / Self Care | Payer: BC Managed Care – PPO | Source: Ambulatory Visit | Attending: Vascular Surgery | Admitting: Vascular Surgery

## 2021-05-11 ENCOUNTER — Ambulatory Visit: Payer: BC Managed Care – PPO | Admitting: Vascular Surgery

## 2021-05-11 ENCOUNTER — Encounter: Payer: Self-pay | Admitting: Vascular Surgery

## 2021-05-11 VITALS — BP 139/98 | HR 88 | Temp 98.0°F | Resp 16 | Ht 62.0 in | Wt 157.0 lb

## 2021-05-11 DIAGNOSIS — I779 Disorder of arteries and arterioles, unspecified: Secondary | ICD-10-CM

## 2021-05-11 DIAGNOSIS — I639 Cerebral infarction, unspecified: Secondary | ICD-10-CM

## 2021-05-11 NOTE — Progress Notes (Signed)
Patient name: Frank Cummings MRN: [id number] DOB: 12-26-1980 Sex: male  REASON FOR VISIT: 26-month follow-up after left carotid endarterectomy  HPI: Frank Cummings is a 40 y.o. male that presents for 54-month follow-up after left carotid endarterectomy on 07/29/2020 for symptomatic left carotid web.  He has no complaints today.  He is doing well from a neurologic standpoint.  He's had  no additional episodes of vision loss or other focal neurologic signs or symptoms over the past 9 months.  He remains on aspirin and statin.  No concerns today.  Past Medical History:  Diagnosis Date   Acute ischemic stroke (HCC) 07/15/2020   Arthritis    gout   Bell's palsy    Depression    Elevated ALT measurement 07/17/2020   Headache    High cholesterol    Hypertriglyceridemia 07/16/2020   Thrombocytopenia (HCC) 07/17/2020    Past Surgical History:  Procedure Laterality Date   ENDARTERECTOMY Left 07/29/2020   Procedure: ENDARTERECTOMY CAROTID LEFT;  Surgeon: Cephus Shelling, MD;  Location: [facility] OR;  Service: Vascular;  Laterality: Left;   TEE WITHOUT CARDIOVERSION N/A 07/17/2020   Procedure: TRANSESOPHAGEAL ECHOCARDIOGRAM (TEE);  Surgeon: Dolores Patty, MD;  Location: [facility] ENDOSCOPY;  Service: Cardiovascular;  Laterality: N/A;    Family History  Problem Relation Age of Onset   Cancer Mother    Hypertension Father     SOCIAL HISTORY: Social History   Tobacco Use   Smoking status: Former    Types: Cigarettes    Quit date: 07/15/2020    Years since quitting: 0.8   Smokeless tobacco: Never  Substance Use Topics   Alcohol use: Yes    Comment: occasional    No Known Allergies  Current Outpatient Medications  Medication Sig Dispense Refill   aspirin EC 81 MG EC tablet Take 1 tablet (81 mg total) by mouth daily. Swallow whole.     atorvastatin (LIPITOR) 40 MG tablet Take 1 tablet (40 mg total) by mouth daily. 30 tablet 2   No current facility-administered medications for this visit.     REVIEW OF SYSTEMS:  [X]  denotes positive finding, [ ]  denotes negative finding Cardiac  Comments:  Chest pain or chest pressure:    Shortness of breath upon exertion:    Short of breath when lying flat:    Irregular heart rhythm:        Vascular    Pain in calf, thigh, or hip brought on by ambulation:    Pain in feet at night that wakes you up from your sleep:     Blood clot in your veins:    Leg swelling:         Pulmonary    Oxygen at home:    Productive cough:     Wheezing:         Neurologic    Sudden weakness in arms or legs:     Sudden numbness in arms or legs:     Sudden onset of difficulty speaking or slurred speech:    Temporary loss of vision in one eye:     Problems with dizziness:         Gastrointestinal    Blood in stool:     Vomited blood:         Genitourinary    Burning when urinating:     Blood in urine:        Psychiatric    Major depression:         Hematologic  Bleeding problems:    Problems with blood clotting too easily:        Skin    Rashes or ulcers:        Constitutional    Fever or chills:      PHYSICAL EXAM: Vitals:   05/11/21 1543 05/11/21 1549 05/11/21 1550  BP: (!) 148/101 126/70 (!) 139/98  Pulse: 97 88 88  Resp: 16    Temp: 98 F (36.7 C)    TempSrc: Temporal    SpO2: 97%    Weight: 157 lb (71.2 kg)    Height: 5\' 2"  (1.575 m)      GENERAL: The patient is a well-nourished male, in no acute distress. The vital signs are documented above. CARDIAC: There is a regular rate and rhythm.  VASCULAR:  Left neck incision well healed NEURO: No focal deficits.  Neurologically intact.  CN II-XII grossly intact.  DATA:   Carotid duplex today shows patent carotids bilaterally with velocities in the minimal 1-39% stenosis range.  Assessment/Plan:  40 year old male status post left carotid endarterectomy on 07/29/2020 for symptomatic carotid web.  On 46-month follow-up today he is doing very well.  He has had no  additional neurologic events.  Carotid duplex today shows his carotids are widely patent bilaterally.  Discussed plan for follow-up in 1 year with carotid duplex.  Remain on aspirin statin from my standpoint.  7-month, MD [facility] Office: [phone]

## 2021-05-14 DIAGNOSIS — M10079 Idiopathic gout, unspecified ankle and foot: Secondary | ICD-10-CM | POA: Diagnosis not present

## 2021-05-14 DIAGNOSIS — M79672 Pain in left foot: Secondary | ICD-10-CM | POA: Diagnosis not present

## 2021-05-20 ENCOUNTER — Other Ambulatory Visit: Payer: Self-pay

## 2021-05-20 ENCOUNTER — Inpatient Hospital Stay: Payer: BC Managed Care – PPO | Attending: Hematology and Oncology | Admitting: Hematology and Oncology

## 2021-05-20 ENCOUNTER — Encounter: Payer: Self-pay | Admitting: Hematology and Oncology

## 2021-05-20 ENCOUNTER — Inpatient Hospital Stay: Payer: BC Managed Care – PPO

## 2021-05-20 DIAGNOSIS — E781 Pure hyperglyceridemia: Secondary | ICD-10-CM | POA: Diagnosis not present

## 2021-05-20 DIAGNOSIS — R229 Localized swelling, mass and lump, unspecified: Secondary | ICD-10-CM | POA: Insufficient documentation

## 2021-05-20 DIAGNOSIS — E7800 Pure hypercholesterolemia, unspecified: Secondary | ICD-10-CM | POA: Diagnosis not present

## 2021-05-20 DIAGNOSIS — M109 Gout, unspecified: Secondary | ICD-10-CM | POA: Insufficient documentation

## 2021-05-20 DIAGNOSIS — D696 Thrombocytopenia, unspecified: Secondary | ICD-10-CM | POA: Insufficient documentation

## 2021-05-20 DIAGNOSIS — Z8673 Personal history of transient ischemic attack (TIA), and cerebral infarction without residual deficits: Secondary | ICD-10-CM | POA: Diagnosis not present

## 2021-05-20 DIAGNOSIS — Z87891 Personal history of nicotine dependence: Secondary | ICD-10-CM | POA: Diagnosis not present

## 2021-05-20 DIAGNOSIS — Z7982 Long term (current) use of aspirin: Secondary | ICD-10-CM | POA: Insufficient documentation

## 2021-05-20 LAB — CBC WITH DIFFERENTIAL/PLATELET
Abs Immature Granulocytes: 0.01 10*3/uL (ref 0.00–0.07)
Basophils Absolute: 0.1 10*3/uL (ref 0.0–0.1)
Basophils Relative: 1 %
Eosinophils Absolute: 0.2 10*3/uL (ref 0.0–0.5)
Eosinophils Relative: 3 %
HCT: 44.3 % (ref 39.0–52.0)
Hemoglobin: 15 g/dL (ref 13.0–17.0)
Immature Granulocytes: 0 %
Lymphocytes Relative: 31 %
Lymphs Abs: 2.1 10*3/uL (ref 0.7–4.0)
MCH: 27.7 pg (ref 26.0–34.0)
MCHC: 33.9 g/dL (ref 30.0–36.0)
MCV: 81.7 fL (ref 80.0–100.0)
Monocytes Absolute: 0.4 10*3/uL (ref 0.1–1.0)
Monocytes Relative: 5 %
Neutro Abs: 4.1 10*3/uL (ref 1.7–7.7)
Neutrophils Relative %: 60 %
Platelets: 115 10*3/uL — ABNORMAL LOW (ref 150–400)
RBC: 5.42 MIL/uL (ref 4.22–5.81)
RDW: 13.2 % (ref 11.5–15.5)
WBC: 6.8 10*3/uL (ref 4.0–10.5)
nRBC: 0 % (ref 0.0–0.2)

## 2021-05-20 NOTE — Progress Notes (Signed)
[facility] CONSULT NOTE  Patient Care Team: Farris Has, MD as PCP - General (Family Medicine)  CHIEF COMPLAINTS/PURPOSE OF CONSULTATION:  Thrombocytopenia.  ASSESSMENT & PLAN:   Thrombocytopenia (HCC) This is a very pleasant 40 year old male patient with past medical history significant for acute ischemic stroke, carotid stenosis, hypertriglyceridemia, dyslipidemia referred to hematology for evaluation of thrombocytopenia. Patient had chronic thrombocytopenia at least on my review of from 2010.  His platelet count usually ranges from 80-110,000.  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  He does not have any new concerning review of systems.  Physical examination again unremarkable.  We have reviewed labs from today.  Platelet count stable 115,000.  We have discussed about follow-up in 4 months with repeat labs.  He understands the increased risk of bleeding with thrombocytopenia, currently he does not have any symptoms.  Skin nodule He has noticed some palpable skin nodules which are painful on the abdominal wall.  On physical exam, these are palpable and tenderness could be elicited.  There is no overlying skin rash.  These do not appear to be closer to any internal organs.  I have encouraged him to follow-up and give his PCP a call if the skin nodules continue to grow larger or continue to bother him.  He expressed understanding.  No orders of the defined types were placed in this encounter.    HISTORY OF PRESENTING ILLNESS:   Frank Cummings 40 y.o. male is here because of Thrombocytopenia.  This is a very pleasant 40 year old male patient with past medical history significant for acute ischemic stroke, Bell's palsy, gouty arthritis, high cholesterol, chronic thrombocytopenia who has recently moved from [facility] to [place] and is being referred to hematology for follow-up  and recommendations.  Interval History  Patient is here for a follow-up.  Since last visit, he has noted some painful skin bumps on the abdomen otherwise denies any complaints.  No fevers drenching night sweats, loss of appetite or loss of weight.  No bleeding in his stool.  No gum bleeding or epistaxis.  No change in his urinary habits or neurological complaints.  He has started taking allopurinol for gout.  He is otherwise on aspirin and atorvastatin and denies any noncompliance.  Rest of the pertinent 10 point ROS reviewed and negative.  REVIEW OF SYSTEMS:   Constitutional: Denies fevers, chills or abnormal night sweats Eyes: Denies blurriness of vision, double vision or watery eyes Ears, nose, mouth, throat, and face: Denies mucositis or sore throat Respiratory: Denies cough, dyspnea or wheezes Cardiovascular: Denies palpitation, chest discomfort or lower extremity swelling Gastrointestinal:  Denies nausea, heartburn or change in bowel habits Skin: Denies abnormal skin rashes Lymphatics: Denies new lymphadenopathy or easy bruising Neurological:Denies numbness, tingling or new weaknesses Behavioral/Psych: Mood is stable, no new changes  All other systems were reviewed with the patient and are negative.  MEDICAL HISTORY:  Past Medical History:  Diagnosis Date   Acute ischemic stroke (HCC) 07/15/2020   Arthritis    gout   Bell's palsy    Depression    Elevated ALT measurement 07/17/2020   Headache    High cholesterol    Hypertriglyceridemia 07/16/2020   Thrombocytopenia (HCC) 07/17/2020    SURGICAL HISTORY: Past Surgical History:  Procedure Laterality Date   ENDARTERECTOMY Left 07/29/2020   Procedure: ENDARTERECTOMY CAROTID LEFT;  Surgeon: Cephus Shelling, MD;  Location: [facility] OR;  Service: Vascular;  Laterality: Left;   TEE WITHOUT CARDIOVERSION N/A 07/17/2020   Procedure: TRANSESOPHAGEAL ECHOCARDIOGRAM (TEE);  Surgeon: Dolores Patty, MD;  Location:  [facility] ENDOSCOPY;   Service: Cardiovascular;  Laterality: N/A;    SOCIAL HISTORY: Social History   Socioeconomic History   Marital status: Single    Spouse name: Not on file   Number of children: Not on file   Years of education: Not on file   Highest education level: Not on file  Occupational History   Not on file  Tobacco Use   Smoking status: Former    Types: Cigarettes    Quit date: 07/15/2020    Years since quitting: 0.8   Smokeless tobacco: Never  Substance and Sexual Activity   Alcohol use: Yes    Comment: occasional   Drug use: Never   Sexual activity: Not Currently  Other Topics Concern   Not on file  Social History Narrative   Not on file   Social Determinants of Health   Financial Resource Strain: Not on file  Food Insecurity: Not on file  Transportation Needs: Not on file  Physical Activity: Not on file  Stress: Not on file  Social Connections: Not on file  Intimate Partner Violence: Not on file    FAMILY HISTORY: Family History  Problem Relation Age of Onset   Cancer Mother    Hypertension Father     ALLERGIES:  has No Known Allergies.  MEDICATIONS:  Current Outpatient Medications  Medication Sig Dispense Refill   aspirin EC 81 MG EC tablet Take 1 tablet (81 mg total) by mouth daily. Swallow whole.     atorvastatin (LIPITOR) 40 MG tablet Take 1 tablet (40 mg total) by mouth daily. 30 tablet 2   No current facility-administered medications for this visit.    PHYSICAL EXAMINATION: ECOG PERFORMANCE STATUS: 0 - Asymptomatic  Vitals:   05/20/21 0913  BP: (!) 152/98  Pulse: 76  Resp: 18  Temp: (!) 96.5 F (35.8 C)  SpO2: 96%    Filed Weights   05/20/21 0913  Weight: 159 lb 9 oz (72.4 kg)    Physical Exam Constitutional:      Appearance: Normal appearance.  HENT:     Head: Normocephalic.  Neck:     Vascular: No carotid bruit.  Cardiovascular:     Rate and Rhythm: Normal rate and regular rhythm.     Pulses: Normal pulses.     Heart sounds: Normal  heart sounds.  Pulmonary:     Effort: Pulmonary effort is normal.     Breath sounds: Normal breath sounds.  Abdominal:     General: Abdomen is flat. Bowel sounds are normal.     Palpations: Abdomen is soft.  Musculoskeletal:        General: Normal range of motion.     Cervical back: Normal range of motion and neck supple.  Lymphadenopathy:     Cervical: No cervical adenopathy.  Skin:    General: Skin is warm and dry.  Neurological:     General: No focal deficit present.     Mental Status: He is alert.  Psychiatric:        Mood and Affect: Mood normal.     LABORATORY DATA:  I have reviewed the data as listed Lab Results  Component Value Date   WBC 6.8 05/20/2021   HGB 15.0 05/20/2021   HCT 44.3 05/20/2021   MCV 81.7 05/20/2021   PLT 115 (L) 05/20/2021     Chemistry  Component Value Date/Time   NA 142 12/23/2020 1010   K 4.0 12/23/2020 1010   CL 104 12/23/2020 1010   CO2 28 12/23/2020 1010   BUN 12 12/23/2020 1010   CREATININE 0.93 12/23/2020 1010      Component Value Date/Time   CALCIUM 9.6 12/23/2020 1010   ALKPHOS 119 12/23/2020 1010   AST 25 12/23/2020 1010   ALT 46 (H) 12/23/2020 1010   BILITOT 0.8 12/23/2020 1010     I have reviewed his labs from 2010. He has had chronic thrombocytopenia. Most recent labs reviewed  Platelets of 99 K, mild ALT elevation, otherwise no findings No nutritional deficiencies No hemolysis.  Labs from today showed normal white blood cell count and red blood cell count, mild thrombocytopenia with platelet count of 115,000  RADIOGRAPHIC STUDIES: I have personally reviewed the radiological images as listed and agreed with the findings in the report. VAS US CAROTID  Result Date: 05/11/2021 Carotid Arterial Duplex Study Patient Name:  TADEO Chowning  Date of Exam:   05/11/2021 Medical Rec #: [id number]    Accession #:    [id number] Date of Birth: [birth year]    Patient Gender: M Patient Age:   80 years Exam [facility] Procedure:      VAS US CAROTID Referring Phys: Sherald Hess --------------------------------------------------------------------------------  Indications:       Left CEA on 07/29/20. Comparison Study:  No previous post op exam. Performing Technologist: Thereasa Parkin RVT  Examination Guidelines: A complete evaluation includes B-mode imaging, spectral Doppler, color Doppler, and power Doppler as needed of all accessible portions of each vessel. Bilateral testing is considered an integral part of a complete examination. Limited examinations for reoccurring indications may be performed as noted.  Right Carotid Findings: +----------+--------+--------+--------+------------------+--------+           PSV cm/sEDV cm/sStenosisPlaque DescriptionComments +----------+--------+--------+--------+------------------+--------+ CCA Prox  69      13                                         +----------+--------+--------+--------+------------------+--------+ CCA Mid   99      24                                         +----------+--------+--------+--------+------------------+--------+ CCA Distal86      27                                         +----------+--------+--------+--------+------------------+--------+ ICA Prox  42      18      1-39%                              +----------+--------+--------+--------+------------------+--------+ ICA Mid   69      28                                         +----------+--------+--------+--------+------------------+--------+ ICA Distal53      24                                         +----------+--------+--------+--------+------------------+--------+  ECA       102     13                                         +----------+--------+--------+--------+------------------+--------+ +----------+--------+-------+----------------+-------------------+           PSV cm/sEDV cmsDescribe        Arm Pressure (mmHG)  +----------+--------+-------+----------------+-------------------+ [id number]            Multiphasic, WNL                    +----------+--------+-------+----------------+-------------------+ +---------+--------+--+--------+--+---------+ VertebralPSV cm/s46EDV cm/s16Antegrade +---------+--------+--+--------+--+---------+  Left Carotid Findings: +----------+--------+--------+--------+------------------+--------+           PSV cm/sEDV cm/sStenosisPlaque DescriptionComments +----------+--------+--------+--------+------------------+--------+ CCA Prox  88      23                                         +----------+--------+--------+--------+------------------+--------+ CCA Mid   92      27                                         +----------+--------+--------+--------+------------------+--------+ CCA Distal74      22                                         +----------+--------+--------+--------+------------------+--------+ ICA Prox  58      24      1-39%                              +----------+--------+--------+--------+------------------+--------+ ICA Mid   67      30                                         +----------+--------+--------+--------+------------------+--------+ ICA Distal72      37                                         +----------+--------+--------+--------+------------------+--------+ ECA       74      14                                         +----------+--------+--------+--------+------------------+--------+ +----------+--------+--------+----------------+-------------------+           PSV cm/sEDV cm/sDescribe        Arm Pressure (mmHG) +----------+--------+--------+----------------+-------------------+ [id number]             Multiphasic, WNL                    +----------+--------+--------+----------------+-------------------+ +---------+--------+--+--------+--+---------+ VertebralPSV cm/s53EDV  cm/s19Antegrade +---------+--------+--+--------+--+---------+   Summary: Right Carotid: Velocities in the right ICA are consistent with a 1-39% stenosis. Left Carotid: Velocities in the left ICA are consistent with a 1-39% stenosis. Vertebrals:  Bilateral vertebral arteries demonstrate antegrade flow. Right  vertebral artery demonstrates bidirectional flow. Subclavians: Normal flow hemodynamics were seen in bilateral subclavian              arteries. *See table(s) above for measurements and observations.  Electronically signed by Sherald Hess MD on 05/11/2021 at 3:54:43 PM.    Final     All questions were answered. The patient knows to call the clinic with any problems, questions or concerns. I spent 20 minutes in the care of this patient including H and P, review of records, counseling and coordination of care. We discussed about causes of thrombocytopenia, symptoms and signs of thrombocytopenia, surveillance recommendations and follow-up regarding his skin rash.  Rachel Moulds, MD 05/20/2021 10:03 AM

## 2021-05-20 NOTE — Assessment & Plan Note (Signed)
He has noticed some palpable skin nodules which are painful on the abdominal wall.  On physical exam, these are palpable and tenderness could be elicited.  There is no overlying skin rash.  These do not appear to be closer to any internal organs.  I have encouraged him to follow-up and give his PCP a call if the skin nodules continue to grow larger or continue to bother him.  He expressed understanding.

## 2021-05-20 NOTE — Assessment & Plan Note (Signed)
This is a very pleasant 40 year old male patient with past medical history significant for acute ischemic stroke, carotid stenosis, hypertriglyceridemia, dyslipidemia referred to hematology for evaluation of thrombocytopenia. Patient had chronic thrombocytopenia at least on my review of from 2010.  His platelet count usually ranges from 80-110,000.  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  He does not have any new concerning review of systems.  Physical examination again unremarkable.  We have reviewed labs from today.  Platelet count stable 115,000.  We have discussed about follow-up in 4 months with repeat labs.  He understands the increased risk of bleeding with thrombocytopenia, currently he does not have any symptoms.

## 2021-06-03 ENCOUNTER — Other Ambulatory Visit: Payer: Self-pay

## 2021-06-03 ENCOUNTER — Ambulatory Visit (INDEPENDENT_AMBULATORY_CARE_PROVIDER_SITE_OTHER): Payer: BC Managed Care – PPO | Admitting: Adult Health

## 2021-06-03 ENCOUNTER — Encounter: Payer: Self-pay | Admitting: Adult Health

## 2021-06-03 VITALS — BP 140/87 | HR 63 | Ht 63.0 in | Wt 160.0 lb

## 2021-06-03 DIAGNOSIS — E785 Hyperlipidemia, unspecified: Secondary | ICD-10-CM

## 2021-06-03 DIAGNOSIS — I6381 Other cerebral infarction due to occlusion or stenosis of small artery: Secondary | ICD-10-CM | POA: Diagnosis not present

## 2021-06-03 DIAGNOSIS — I6522 Occlusion and stenosis of left carotid artery: Secondary | ICD-10-CM

## 2021-06-03 NOTE — Patient Instructions (Signed)
Continue aspirin 81 mg daily  and atorvastatin  for secondary stroke prevention  Continue to follow up with PCP regarding cholesterol and blood pressure management/monitoring Maintain strict control of hypertension with blood pressure goal below 130/90, diabetes with hemoglobin A1c goal below 6.5% and cholesterol with LDL cholesterol (bad cholesterol) goal below 70 mg/dL.       Followup in the future with me in 6 months or call earlier if needed       Thank you for coming to see [place] at [facility]. I hope we have been able to provide you high quality care today.  You may receive a patient satisfaction survey over the next few weeks. We would appreciate your feedback and comments so that we may continue to improve ourselves and the health of our patients.

## 2021-06-03 NOTE — Progress Notes (Signed)
[facility] [address] [place]. [place] [address] [phone]       STROKE FOLLOW UP NOTE  [name] Date of Birth:  11-18-1980 Medical Record Number:  [id number]   Reason for Referral: stroke follow up    SUBJECTIVE:   CHIEF COMPLAINT:  Chief Complaint  Patient presents with   Follow-up    RM 2 alone Pt is well and stable, R side has improved. No new concerns     HPI:   Update 06/03/2021 JM: returns for 6 month stroke follow up unaccompanied. Overall doing well since prior visit. Reports improvement of right hand - currently at 90% recovery. Denies new stroke/TIA symptoms. Remains on aspirin and atorvastatin - denies side effects.  Blood pressure today 140/87 - routinely monitors at home - typically stable Reports recent lab work by PCP which was satisfactory (unable to view via epic). No new concerns at this time.     History provided for reference purposes  Update 12/02/2020 JM: Frank Cummings returns for 78-month stroke follow-up unaccompanied  Doing well since prior visit with mild residual right hand weakness but otherwise no residual deficits.  He denies new or worsening stroke/TIA symptoms  Reports compliance on aspirin and atorvastatin without associated side effects Blood pressure today 137/89 -he does not routinely monitor at home  He does report occasional discomfort near his left CEA site especially when laying down on his left side or with increased pressure.  Describes as pins/needles sensation and can be sensitive to touch at times  No further concerns at this time   Update 09/01/2020 JM:, Frank Cummings is being seen for hospital follow-up unaccompanied.  He has been slowly recovering since discharge but reports continued gait impairment and altered right-sided sensory.  He has not done any type of therapy since discharge but is interested in PT.  He has returned back to all prior activities including working.  Denies new or worsening  stroke/TIA symptoms.  Underwent left carotid endarterectomy on 07/29/2020 by Dr. Chestine Spore without complication.  Plans on repeating carotid duplex 9 months post procedure with Dr. Chestine Spore.  He completed recommended 1 month DAPT course and remains on aspirin alone without bleeding or bruising.  Remains on atorvastatin 40 mg daily without myalgias.  Blood pressure today satisfactory at 121/85.  No further concerns at this time.  Stroke admission 07/15/2020 Frank Cummings is a 40 y.o. male with history of Bell's palsy in 2017 (resolved) who presented to [facility] ED on 07/15/2020 with right-sided numbness.  Personally reviewed hospitalization pertinent progress notes, lab update 5 and imaging with summary provided.  Evaluated by Dr. 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  Recommended holding off on any type of surgical procedure until other etiologies ruled out.  TEE showed very trivial PFO felt to not be of clinical significance and LE Dopplers negative for DVT.  Hypercoagulable labs pending at discharge.  Recommended DAPT for 3 weeks followed by aspirin alone.  LDL 135 and initiate atorvastatin 40 mg daily.  No prior history of HTN or DM with A1c 5.3.  No prior stroke history.  Evaluated by the cardiologist visit she received CPR seen and recommended discharge home with outpatient PT/OT.  Stroke:  Left subcortical stroke, given size, location and abnormal LICA web, this is likely embolic (cardioemboic vs large vessle embolic etiology from possible LICA web abnormality under investigation  Code Stroke CT head No acute abnormality. ASPECTS 10.    CTA head & neck:  No large vessel occlusion or hemodynamically significant proximal stenosis in the head or neck. 2. 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 This finding has been reported as having an  increased risk of ischemic stroke, thus we will check a CUS to better evaluate for this MRI:  2 cm curvilinear acute ischemic perforator type infarct extending from the posterior left lentiform nucleus towards the left corona radiata.  Carotid Doppler no significant bilateral extracranial stenosis. 2D Echo w/Bubble: Slight inferior wall hypokinesis.  Ejection fraction 60 to 65%. LE muscle no significant next is my dictation small and he does take his mind this for you just rolling is a blood is cleared medically constantly is like it is such a big difference MSSA its: Negative for DVT recent long car ride noted, but no current DVT complaints TEE: EF 60 to 65%; very trivial PFO felt to not be of clinical significance TCD: negative  LDL 135 HgbA1c 5.3 none prior to admission, now on ASA 81mg  + Plavix 75mg  PO daily x3 weeks, then ASA monotherapy Therapy recommendations:  OP PT/OT Disposition:  home     ROS:   14 system review of systems performed and negative with exception of those listed in HPI  PMH:  Past Medical History:  Diagnosis Date   Acute ischemic stroke (HCC) 07/15/2020   Arthritis    gout   Bell's palsy    Depression    Elevated ALT measurement 07/17/2020   Headache    High cholesterol    Hypertriglyceridemia 07/16/2020   Thrombocytopenia (HCC) 07/17/2020    PSH:  Past Surgical History:  Procedure Laterality Date   ENDARTERECTOMY Left 07/29/2020   Procedure: ENDARTERECTOMY CAROTID LEFT;  Surgeon: 14/05/2020, MD;  Location: [facility] OR;  Service: Vascular;  Laterality: Left;   TEE WITHOUT CARDIOVERSION N/A 07/17/2020   Procedure: TRANSESOPHAGEAL ECHOCARDIOGRAM (TEE);  Surgeon: [facility], MD;  Location: [facility] ENDOSCOPY;  Service: Cardiovascular;  Laterality: N/A;    Social History:  Social History   Socioeconomic History   Marital status: Single    Spouse name: Not on file   Number of children: Not on file   Years of education: Not on file   Highest  education level: Not on file  Occupational History   Not on file  Tobacco Use   Smoking status: Former    Types: Cigarettes    Quit date: 07/15/2020    Years since quitting: 0.8   Smokeless tobacco: Never  Substance and Sexual Activity   Alcohol use: Yes    Comment: occasional   Drug use: Never   Sexual activity: Not Currently  Other Topics Concern   Not on file  Social History Narrative   Not on file   Social Determinants of Health   Financial Resource Strain: Not on file  Food Insecurity: Not on file  Transportation Needs: Not on file  Physical Activity: Not on file  Stress: Not on file  Social Connections: Not on file  Intimate Partner Violence: Not on file    Family History:  Family History  Problem Relation Age of Onset   Cancer Mother    Hypertension Father     Medications:   Current Outpatient Medications on File Prior to Visit  Medication Sig Dispense Refill   allopurinol (ZYLOPRIM) 100 MG tablet Take 100 mg by mouth daily.     aspirin EC 81 MG  EC tablet Take 1 tablet (81 mg total) by mouth daily. Swallow whole.     atorvastatin (LIPITOR) 40 MG tablet Take 1 tablet (40 mg total) by mouth daily. 30 tablet 2   No current facility-administered medications on file prior to visit.    Allergies:  No Known Allergies    OBJECTIVE:  Physical Exam  Vitals:   06/03/21 1536  BP: 140/87  Pulse: 63  Weight: 160 lb (72.6 kg)  Height: 5\' 3"  (1.6 m)   Body mass index is 28.34 kg/m. No results found.   General: well developed, well nourished, very pleasant middle-aged Asian male, seated, in no evident distress Head: head normocephalic and atraumatic.   Neck: supple with no carotid or supraclavicular bruits Cardiovascular: regular rate and rhythm, no murmurs Musculoskeletal: no deformity Skin:  no rash/petichiae Vascular:  Normal pulses all extremities   Neurologic Exam Mental Status: Awake and fully alert. Fluent speech and language. Oriented to place  and time. Recent and remote memory intact. Attention span, concentration and fund of knowledge appropriate. Mood and affect appropriate.  Cranial Nerves: Pupils equal, briskly reactive to light. Extraocular movements full without nystagmus. Visual fields full to confrontation. Hearing intact. Facial sensation intact. Face, tongue, palate moves normally and symmetrically.  Motor: Normal bulk and tone. Normal strength in all tested extremity muscles Sensory.: intact to touch , pinprick , position and vibratory sensation Coordination: Rapid alternating movements normal in all extremities. Finger-to-nose and heel-to-shin performed accurately bilaterally. Gait and Station: Arises from chair without difficulty. Stance is normal. Gait demonstrates  normal stride length and balance without assistive device.  Able to tandem walk and heel toe without difficulty Reflexes: 1+ and symmetric. Toes downgoing.       ASSESSMENT: Frank Cummings is a 40 y.o. 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 Vascular risk factors include HLD.      PLAN:  L BG stroke, cryptogenic:  Residual subjective mild right hand weakness - improvement since prior visit  Continue aspirin 81 mg daily  and atorvastatin 40 mg daily for secondary stroke prevention.   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 07/31/2020 without complication.  Recent repeat carotid ultrasound b/l 1-39% stenosis. Plan to repeat in 1 year HLD: LDL goal <70.  On atorvastatin 40 mg daily managed by PCP.      Follow up in 6 months or call earlier if needed   CC:  Chestine Spore, MD    I spent 32 minutes of face-to-face and non-face-to-face time with  patient.  This included previsit chart review, lab review, study review, electronic health record documentation, patient education and discussion regarding prior stroke, residual deficits, secondary stroke prevention measures and aggressive stroke risk factor managementand answered al other questions to patients satisfaction   Farris Has, AGNP-BC  [facility] [address] [place], [place]  Phone [phone] Fax [phone] Note: This document was prepared with digital dictation and possible smart phrase technology. Any transcriptional errors that result from this process are unintentional.

## 2021-09-22 NOTE — Progress Notes (Signed)
[place] NOTE  Patient Care Team: London Pepper, MD as PCP - General (Family Medicine)  CHIEF COMPLAINTS/PURPOSE OF CONSULTATION:  Thrombocytopenia.  ASSESSMENT & PLAN:   Thrombocytopenia (Frank Cummings) This is a very pleasant 41 year old male patient with past medical history significant for acute ischemic stroke, carotid stenosis, hypertriglyceridemia, dyslipidemia referred to hematology for evaluation of thrombocytopenia. Patient had chronic thrombocytopenia at least on my review of from 2010.  His platelet count usually ranges from 80-110,000.  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  No new concerns on ROS PE findings unremarkable today Labs today showed platelet count of 104K which is his baseline. He can follow up with [place] annually since this has remained stable overall. He was instructed to call us sooner with any new concerns or questions.    No orders of the defined types were placed in this encounter.     HISTORY OF PRESENTING ILLNESS:   Frank Cummings 41 y.o. male is here because of Thrombocytopenia.  This is a very pleasant 41 year old male patient with past medical history significant for acute ischemic stroke, Bell's palsy, gouty arthritis, high cholesterol, chronic thrombocytopenia who has recently moved from [facility] to [place] and is being referred to hematology for follow-up and recommendations.  Interval History  Patient is here for a follow-up.  Since last visit, he has been doing okay.  He continues to deal with some intermittent fatigue, recovered completely from stroke.  No fevers, drenching night sweats, loss of appetite or loss of weight.  No new medications.  No interim infections or hospitalizations.  No bleeding complaints.  He has been taking his medications as prescribed.  He has not had any episodes of gout since his last visit hence he  has not been taking allopurinol on a daily basis.  No hematochezia, melena or hematuria  Rest of the pertinent 10 point ROS reviewed and negative.  REVIEW OF SYSTEMS:   Constitutional: Denies fevers, chills or abnormal night sweats Eyes: Denies blurriness of vision, double vision or watery eyes Ears, nose, mouth, throat, and face: Denies mucositis or sore throat Respiratory: Denies cough, dyspnea or wheezes Cardiovascular: Denies palpitation, chest discomfort or lower extremity swelling Gastrointestinal:  Denies nausea, heartburn or change in bowel habits Skin: Denies abnormal skin rashes Lymphatics: Denies new lymphadenopathy or easy bruising Neurological:Denies numbness, tingling or new weaknesses Behavioral/Psych: Mood is stable, no new changes  All other systems were reviewed with the patient and are negative.  MEDICAL HISTORY:  Past Medical History:  Diagnosis Date   Acute ischemic stroke ([place]) 07/15/2020   Arthritis    gout   Bell's palsy    Depression    Elevated ALT measurement 07/17/2020   Headache    High cholesterol    Hypertriglyceridemia 07/16/2020   Thrombocytopenia ([place]) 07/17/2020    SURGICAL HISTORY: Past Surgical History:  Procedure Laterality Date   ENDARTERECTOMY Left 07/29/2020   Procedure: ENDARTERECTOMY CAROTID LEFT;  Surgeon: Marty Heck, MD;  Location: [facility];  Service: Vascular;  Laterality: Left;   TEE WITHOUT CARDIOVERSION N/A 07/17/2020   Procedure: TRANSESOPHAGEAL ECHOCARDIOGRAM (TEE);  Surgeon: Jolaine Artist, MD;  Location: [facility] ENDOSCOPY;  Service: Cardiovascular;  Laterality: N/A;    SOCIAL HISTORY: Social History   Socioeconomic History   Marital status: Single    Spouse name: Not on file   Number of children: Not on file   Years of education: Not  on file   Highest education level: Not on file  Occupational History   Not on file  Tobacco Use   Smoking status: Former    Types: Cigarettes    Quit date: 07/15/2020     Years since quitting: 1.1   Smokeless tobacco: Never  Substance and Sexual Activity   Alcohol use: Yes    Comment: occasional   Drug use: Never   Sexual activity: Not Currently  Other Topics Concern   Not on file  Social History Narrative   Not on file   Social Determinants of Health   Financial Resource Strain: Not on file  Food Insecurity: Not on file  Transportation Needs: Not on file  Physical Activity: Not on file  Stress: Not on file  Social Connections: Not on file  Intimate Partner Violence: Not on file    FAMILY HISTORY: Family History  Problem Relation Age of Onset   Cancer Mother    Hypertension Father     ALLERGIES:  has No Known Allergies.  MEDICATIONS:  Current Outpatient Medications  Medication Sig Dispense Refill   aspirin EC 81 MG EC tablet Take 1 tablet (81 mg total) by mouth daily. Swallow whole.     atorvastatin (LIPITOR) 40 MG tablet Take 1 tablet (40 mg total) by mouth daily. 30 tablet 2   No current facility-administered medications for this visit.    PHYSICAL EXAMINATION: ECOG PERFORMANCE STATUS: 0 - Asymptomatic  Vitals:   09/23/21 0926  BP: (!) 143/96  Pulse: 90  Resp: 18  Temp: (!) 97.5 F (36.4 C)  SpO2: 97%    Filed Weights   09/23/21 0926  Weight: 160 lb 1.6 oz (72.6 kg)    Physical Exam Constitutional:      Appearance: Normal appearance.  HENT:     Head: Normocephalic.  Neck:     Vascular: No carotid bruit.  Cardiovascular:     Rate and Rhythm: Normal rate and regular rhythm.     Pulses: Normal pulses.     Heart sounds: Normal heart sounds.  Pulmonary:     Effort: Pulmonary effort is normal.     Breath sounds: Normal breath sounds.  Abdominal:     General: Abdomen is flat. Bowel sounds are normal.     Palpations: Abdomen is soft.  Musculoskeletal:        General: Normal range of motion.     Cervical back: Normal range of motion and neck supple.  Lymphadenopathy:     Cervical: No cervical adenopathy.   Skin:    General: Skin is warm and dry.  Neurological:     General: No focal deficit present.     Mental Status: He is alert.  Psychiatric:        Mood and Affect: Mood normal.     LABORATORY DATA:  I have reviewed the data as listed Lab Results  Component Value Date   WBC 7.8 09/23/2021   HGB 16.1 09/23/2021   HCT 46.9 09/23/2021   MCV 83.6 09/23/2021   PLT 104 (L) 09/23/2021     Chemistry      Component Value Date/Time   NA 142 12/23/2020 1010   K 4.0 12/23/2020 1010   CL 104 12/23/2020 1010   CO2 28 12/23/2020 1010   BUN 12 12/23/2020 1010   CREATININE 0.93 12/23/2020 1010      Component Value Date/Time   CALCIUM 9.6 12/23/2020 1010   ALKPHOS 119 12/23/2020 1010   AST 25 12/23/2020 1010  ALT 46 (H) 12/23/2020 1010   BILITOT 0.8 12/23/2020 1010     I have reviewed his labs from 2010. He has had chronic thrombocytopenia. Labs today show WBC 7.8, Hb 16.1, Platelets of 104K  RADIOGRAPHIC STUDIES: I have personally reviewed the radiological images as listed and agreed with the findings in the report. No results found.  All questions were answered. The patient knows to call the clinic with any problems, questions or concerns. I spent 20 minutes in the care of this patient including H and P, review of records, counseling and coordination of care. We discussed about causes of thrombocytopenia, symptoms and signs of thrombocytopenia, surveillance recommendations.    Benay Pike, MD 09/23/2021 10:55 AM

## 2021-09-23 ENCOUNTER — Other Ambulatory Visit: Payer: Self-pay

## 2021-09-23 ENCOUNTER — Inpatient Hospital Stay: Payer: 59 | Attending: Hematology and Oncology | Admitting: Hematology and Oncology

## 2021-09-23 ENCOUNTER — Inpatient Hospital Stay: Payer: 59

## 2021-09-23 DIAGNOSIS — Z809 Family history of malignant neoplasm, unspecified: Secondary | ICD-10-CM | POA: Diagnosis not present

## 2021-09-23 DIAGNOSIS — R200 Anesthesia of skin: Secondary | ICD-10-CM

## 2021-09-23 DIAGNOSIS — D696 Thrombocytopenia, unspecified: Secondary | ICD-10-CM | POA: Insufficient documentation

## 2021-09-23 DIAGNOSIS — Z87891 Personal history of nicotine dependence: Secondary | ICD-10-CM | POA: Diagnosis not present

## 2021-09-23 DIAGNOSIS — I639 Cerebral infarction, unspecified: Secondary | ICD-10-CM

## 2021-09-23 LAB — CBC WITH DIFFERENTIAL/PLATELET
Abs Immature Granulocytes: 0.01 10*3/uL (ref 0.00–0.07)
Basophils Absolute: 0.1 10*3/uL (ref 0.0–0.1)
Basophils Relative: 1 %
Eosinophils Absolute: 0.2 10*3/uL (ref 0.0–0.5)
Eosinophils Relative: 2 %
HCT: 46.9 % (ref 39.0–52.0)
Hemoglobin: 16.1 g/dL (ref 13.0–17.0)
Immature Granulocytes: 0 %
Lymphocytes Relative: 26 %
Lymphs Abs: 2 10*3/uL (ref 0.7–4.0)
MCH: 28.7 pg (ref 26.0–34.0)
MCHC: 34.3 g/dL (ref 30.0–36.0)
MCV: 83.6 fL (ref 80.0–100.0)
Monocytes Absolute: 0.4 10*3/uL (ref 0.1–1.0)
Monocytes Relative: 5 %
Neutro Abs: 5.1 10*3/uL (ref 1.7–7.7)
Neutrophils Relative %: 66 %
Platelets: 104 10*3/uL — ABNORMAL LOW (ref 150–400)
RBC: 5.61 MIL/uL (ref 4.22–5.81)
RDW: 12.8 % (ref 11.5–15.5)
WBC: 7.8 10*3/uL (ref 4.0–10.5)
nRBC: 0 % (ref 0.0–0.2)

## 2021-09-23 NOTE — Assessment & Plan Note (Signed)
This is a very pleasant 41 year old male patient with past medical history significant for acute ischemic stroke, carotid stenosis, hypertriglyceridemia, dyslipidemia referred to hematology for evaluation of thrombocytopenia. Patient had chronic thrombocytopenia at least on my review of from 2010.  His platelet count usually ranges from 80-110,000.  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  No new concerns on ROS PE findings unremarkable today Labs today showed platelet count of 104K which is his baseline. He can follow up with [place] annually since this has remained stable overall. He was instructed to call us sooner with any new concerns or questions.

## 2021-12-09 ENCOUNTER — Ambulatory Visit: Payer: 59 | Admitting: Adult Health

## 2021-12-09 ENCOUNTER — Encounter: Payer: Self-pay | Admitting: Adult Health

## 2021-12-09 VITALS — BP 130/86 | HR 69 | Ht 62.0 in | Wt 157.0 lb

## 2021-12-09 DIAGNOSIS — I6381 Other cerebral infarction due to occlusion or stenosis of small artery: Secondary | ICD-10-CM | POA: Diagnosis not present

## 2021-12-09 DIAGNOSIS — I69398 Other sequelae of cerebral infarction: Secondary | ICD-10-CM | POA: Diagnosis not present

## 2021-12-09 DIAGNOSIS — G8191 Hemiplegia, unspecified affecting right dominant side: Secondary | ICD-10-CM | POA: Diagnosis not present

## 2021-12-09 DIAGNOSIS — M25561 Pain in right knee: Secondary | ICD-10-CM

## 2021-12-09 DIAGNOSIS — R269 Unspecified abnormalities of gait and mobility: Secondary | ICD-10-CM

## 2021-12-09 NOTE — Patient Instructions (Addendum)
Referral placed for neuro rehab PT for right leg weakness, knee pain and gait impairment- please call them mid next week to schedule initial evaluation ? ?Continue aspirin 81 mg daily  and atorvastatin for secondary stroke prevention ? ?Continue to follow up with PCP regarding cholesterol and blood pressure management  ?Maintain strict control of blood pressure with blood pressure goal below 130/90 and cholesterol with LDL cholesterol (bad cholesterol) goal below 70 mg/dL.  ? ?Signs of a Stroke? Follow the BEFAST method:  ?Balance Watch for a sudden loss of balance, trouble with coordination or vertigo ?Eyes Is there a sudden loss of vision in one or both eyes? Or double vision?  ?Face: Ask the person to smile. Does one side of the face droop or is it numb?  ?Arms: Ask the person to raise both arms. Does one arm drift downward? Is there weakness or numbness of a leg? ?Speech: Ask the person to repeat a simple phrase. Does the speech sound slurred/strange? Is the person confused ? ?Time: If you observe any of these signs, call 911. ? ? ? ? ? ? ? ?Thank you for coming to see [place] at [facility]. I hope we have been able to provide you high quality care today. ? ?You may receive a patient satisfaction survey over the next few weeks. We would appreciate your feedback and comments so that we may continue to improve ourselves and the health of our patients. ? ?

## 2021-12-09 NOTE — Progress Notes (Signed)
?[facility] ?[id number] ?[place]. [place] [address] ?[phone] ? ?     STROKE FOLLOW UP NOTE ? ?[name] ?Date of Birth:  September 20, 1980 ?Medical Record Number:  [id number]  ? ?Reason for Referral: stroke follow up ? ? ? ?SUBJECTIVE: ? ? ?CHIEF COMPLAINT:  ?Chief Complaint  ?Patient presents with  ? Follow-up  ?  Rm 3 alone here for 6 month f/u. Reports he has been doing well- no complaints.   ? ? ?HPI:  ? ?Update 12/09/2021 JM: Patient returns for 34-month stroke follow-up unaccompanied.  Overall stable without new stroke/TIA symptoms. Does report great improvement of right hand weakness but only mild remaining weakness.  Also notes some right leg weakness more noticeable when walking longer distance or walking with resistance (pushing a stroller). Confirms this has been present since his stroke and is not new. Also notes anterior knee pain with prolonged ambulation.  Ambulates without assistive device, no recent falls.  Compliant on aspirin and atorvastatin, denies side effects.  Blood pressure today 130/86.  Routinely followed by PCP as well as oncology for thrombocytopenia.  No further concerns at this time. ? ? ? ?History provided for reference purposes only ?Update 06/03/2021 JM: returns for 6 month stroke follow up unaccompanied. Overall doing well since prior visit. Reports improvement of right hand - currently at 90% recovery. Denies new stroke/TIA symptoms. Remains on aspirin and atorvastatin - denies side effects.  Blood pressure today 140/87 - routinely monitors at home - typically stable Reports recent lab work by PCP which was satisfactory (unable to view via epic). No new concerns at this time.  ? ?Update 12/02/2020 JM: [name] returns for 38-month stroke follow-up unaccompanied ? ?Doing well since prior visit with mild residual right hand weakness but otherwise no residual deficits.  He denies new or worsening stroke/TIA symptoms ? ?Reports compliance on aspirin and atorvastatin  without associated side effects ?Blood pressure today 137/89 -he does not routinely monitor at home ? ?He does report occasional discomfort near his left CEA site especially when laying down on his left side or with increased pressure.  Describes as pins/needles sensation and can be sensitive to touch at times ? ?No further concerns at this time ? ? ?Update 09/01/2020 JM:, [name] is being seen for hospital follow-up unaccompanied.  He has been slowly recovering since discharge but reports continued gait impairment and altered right-sided sensory.  He has not done any type of therapy since discharge but is interested in PT.  He has returned back to all prior activities including working.  Denies new or worsening stroke/TIA symptoms.  Underwent left carotid endarterectomy on 07/29/2020 by Dr. Chestine Spore without complication.  Plans on repeating carotid duplex 9 months post procedure with Dr. Chestine Spore.  He completed recommended 1 month DAPT course and remains on aspirin alone without bleeding or bruising.  Remains on atorvastatin 40 mg daily without myalgias.  Blood pressure today satisfactory at 121/85.  No further concerns at this time. ? ?Stroke admission 07/15/2020 ?[name] is a 41 y.o. male with history of Bell's palsy in 2017 (resolved) who presented to [facility] ED on 07/15/2020 with right-sided numbness.  Personally reviewed hospitalization pertinent progress notes, lab update 5 and imaging with summary provided.  Evaluated by Dr. 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  Recommended holding off on any type of surgical procedure  until other etiologies ruled out.  TEE showed very trivial PFO felt to not be of clinical significance and LE Dopplers negative for DVT.  Hypercoagulable labs pending at discharge.  Recommended DAPT for 3 weeks followed by aspirin  alone.  LDL 135 and initiate atorvastatin 40 mg daily.  No prior history of HTN or DM with A1c 5.3.  No prior stroke history.  Evaluated by the cardiologist visit she received CPR seen and recommended discharge home with outpatient PT/OT. ? ?Stroke:  Left subcortical stroke, given size, location and abnormal LICA web, this is likely embolic (cardioemboic vs large vessle embolic etiology from possible LICA web abnormality under investigation Code Stroke CT head No acute abnormality. ASPECTS 10.    ?CTA head & neck:  No large vessel occlusion or hemodynamically significant proximal stenosis in the head or neck. 2. 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 This finding has been reported as having an increased risk of ischemic stroke, thus we will check a CUS to better evaluate for this ?MRI:  2 cm curvilinear acute ischemic perforator type infarct extending from the posterior left lentiform nucleus towards the left corona radiata.  ?Carotid Doppler no significant bilateral extracranial stenosis. ?2D Echo w/Bubble: Slight inferior wall hypokinesis.  Ejection fraction 60 to 65%. ?LE muscle no significant next is my dictation small and he does take his mind this for you just rolling is a blood is cleared medically constantly is like it is such a big difference MSSA its: Negative for DVT recent long car ride noted, but no current DVT complaints ?TEE: EF 60 to 65%; very trivial PFO felt to not be of clinical significance ?TCD: negative  ?LDL 135 ?HgbA1c 5.3 ?none prior to admission, now on ASA 81mg  + Plavix 75mg  PO daily x3 weeks, then ASA monotherapy ?Therapy recommendations:  OP PT/OT ?Disposition:  home ? ? ? ? ?ROS:   ?14 system review of systems performed and negative with exception of those listed in HPI ? ?PMH:  ?Past Medical History:  ?Diagnosis Date  ? Acute ischemic stroke (HCC) 07/15/2020  ? Arthritis   ? gout  ? Bell's palsy   ? Depression   ? Elevated ALT measurement 07/17/2020  ? Headache   ? High  cholesterol   ? Hypertriglyceridemia 07/16/2020  ? Thrombocytopenia (HCC) 07/17/2020  ? ? ?PSH:  ?Past Surgical History:  ?Procedure Laterality Date  ? ENDARTERECTOMY Left 07/29/2020  ? Procedure: ENDARTERECTOMY CAROTID LEFT;  Surgeon: Cephus Shellinglark, Christopher J, MD;  [facility];  Service: Vascular;  Laterality: Left;  ? TEE WITHOUT CARDIOVERSION N/A 07/17/2020  ? Procedure: TRANSESOPHAGEAL ECHOCARDIOGRAM (TEE);  Surgeon: Dolores PattyBensimhon, Daniel R, MD;  [facility];  Service: Cardiovascular;  Laterality: N/A;  ? ? ?Social History:  ?Social History  ? ?Socioeconomic History  ? Marital status: Single  ?  Spouse name: Not on file  ? Number of children: Not on file  ? Years of education: Not on file  ? Highest education level: Not on file  ?Occupational History  ? Not on file  ?Tobacco Use  ? Smoking status: Former  ?  Types: Cigarettes  ?  Quit date: 07/15/2020  ?  Years since quitting: 1.4  ? Smokeless tobacco: Never  ?Substance and Sexual Activity  ? Alcohol use: Yes  ?  Comment: occasional  ? Drug use: Never  ? Sexual activity: Not Currently  ?Other Topics Concern  ? Not on file  ?Social History Narrative  ? Not on file  ? ?  Social Determinants of Health  ? ?Financial Resource Strain: Not on file  ?Food Insecurity: Not on file  ?Transportation Needs: Not on file  ?Physical Activity: Not on file  ?Stress: Not on file  ?Social Connections: Not on file  ?Intimate Partner Violence: Not on file  ? ? ?Family History:  ?Family History  ?Problem Relation Age of Onset  ? Cancer Mother   ? Hypertension Father   ? ? ?Medications:   ?Current Outpatient Medications on File Prior to Visit  ?Medication Sig Dispense Refill  ? allopurinol (ZYLOPRIM) 100 MG tablet Take 200 mg by mouth daily.    ? aspirin EC 81 MG EC tablet Take 1 tablet (81 mg total) by mouth daily. Swallow whole.    ? atorvastatin (LIPITOR) 40 MG tablet Take 1 tablet (40 mg total) by mouth daily. 30 tablet 2  ? ?No current facility-administered medications on file  prior to visit.  ? ? ?Allergies:  No Known Allergies ? ? ? ?OBJECTIVE: ? ?Physical Exam ? ?Vitals:  ? 12/09/21 1541  ?BP: 130/86  ?Pulse: 69  ?Weight: 157 lb (71.2 kg)  ?Height: 5\' 2"  (1.575 m)  ?

## 2022-01-05 ENCOUNTER — Ambulatory Visit: Payer: 59 | Attending: Adult Health

## 2022-01-05 DIAGNOSIS — R29898 Other symptoms and signs involving the musculoskeletal system: Secondary | ICD-10-CM | POA: Diagnosis present

## 2022-01-05 DIAGNOSIS — M25561 Pain in right knee: Secondary | ICD-10-CM | POA: Insufficient documentation

## 2022-01-05 DIAGNOSIS — R2689 Other abnormalities of gait and mobility: Secondary | ICD-10-CM | POA: Diagnosis present

## 2022-01-05 DIAGNOSIS — R2681 Unsteadiness on feet: Secondary | ICD-10-CM | POA: Diagnosis present

## 2022-01-05 DIAGNOSIS — R262 Difficulty in walking, not elsewhere classified: Secondary | ICD-10-CM | POA: Diagnosis present

## 2022-01-05 DIAGNOSIS — R269 Unspecified abnormalities of gait and mobility: Secondary | ICD-10-CM | POA: Diagnosis not present

## 2022-01-05 DIAGNOSIS — I69398 Other sequelae of cerebral infarction: Secondary | ICD-10-CM | POA: Insufficient documentation

## 2022-01-05 DIAGNOSIS — G8191 Hemiplegia, unspecified affecting right dominant side: Secondary | ICD-10-CM | POA: Diagnosis not present

## 2022-01-05 DIAGNOSIS — I6381 Other cerebral infarction due to occlusion or stenosis of small artery: Secondary | ICD-10-CM | POA: Insufficient documentation

## 2022-01-05 NOTE — Therapy (Signed)
OUTPATIENT PHYSICAL THERAPY NEURO EVALUATION   Patient Name: Frank Cummings MRN: [id number] DOB:1981/03/13, 41 y.o., male Today's Date: 01/05/2022   PCP: London Pepper, MD REFERRING PROVIDER: Frann Rider, NP   PT End of Session - 01/05/22 1156     Visit Number 1    Number of Visits 4    Date for PT Re-Evaluation 03/02/22    Authorization Type [employer]    PT Start Time [id number]    PT Stop Time [id number]    PT Time Calculation (min) 35 min    Activity Tolerance Patient tolerated treatment well    Behavior During Therapy [facility] for tasks assessed/performed             Past Medical History:  Diagnosis Date   Acute ischemic stroke ([place]) 07/15/2020   Arthritis    gout   Bell's palsy    Depression    Elevated ALT measurement 07/17/2020   Headache    High cholesterol    Hypertriglyceridemia 07/16/2020   Thrombocytopenia ([place]) 07/17/2020   Past Surgical History:  Procedure Laterality Date   ENDARTERECTOMY Left 07/29/2020   Procedure: ENDARTERECTOMY CAROTID LEFT;  Surgeon: Marty Heck, MD;  Location: [place];  Service: Vascular;  Laterality: Left;   TEE WITHOUT CARDIOVERSION N/A 07/17/2020   Procedure: TRANSESOPHAGEAL ECHOCARDIOGRAM (TEE);  Surgeon: Jolaine Artist, MD;  Location: [facility] ENDOSCOPY;  Service: Cardiovascular;  Laterality: N/A;   Patient Active Problem List   Diagnosis Date Noted   Skin nodule 05/20/2021   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) 07/16/2020   Right sided numbness 07/15/2020   Acute ischemic stroke ([place]) 07/15/2020    ONSET DATE: 07/15/20  REFERRING DIAG: [id number] (ICD-10-CM) - Gait disturbance, post-stroke G81.91 (ICD-10-CM) - Right hemiparesis (HCC) M25.561 (ICD-10-CM) -  Right anterior knee pain I63.81 (ICD-10-CM) - Basal ganglia stroke (HCC)   THERAPY DIAG:  Difficulty in walking, not elsewhere classified  Unsteadiness on feet  Other abnormalities of gait and mobility  Other symptoms and signs involving the musculoskeletal system  Rationale for Evaluation and Treatment Rehabilitation  SUBJECTIVE:                                                                                                                                                                                              SUBJECTIVE STATEMENT: Had stroke in 07/2020 which caused RUE/RLE weakness and notes continued gait dysfunction and notes some  anterior right knee pain when trying to run. Difficulty in walking up/down stairs. Feels like there is a balance disturbance. Pt notes right knee pain usually occurs after physical activity Pt accompanied by: self  PERTINENT HISTORY: hx of CVA 07/2020  PAIN:  Are you having pain? Yes: NPRS scale: 0/10 Pain location: right anterior knee Pain description: ache/sore Aggravating factors: fast walking or running Relieving factors: rest  PRECAUTIONS: None  WEIGHT BEARING RESTRICTIONS No  FALLS: Has patient fallen in last 6 months? No  LIVING ENVIRONMENT: Lives with: lives with their family Lives in: House/apartment Stairs: Yes: Internal: 12 steps; can reach both Has following equipment at home: None  Computer work for employment  PLOF: Independent  PATIENT GOALS be able to fast walk, run, get up from the floor  OBJECTIVE:   COGNITION: Overall cognitive status: Within functional limits for tasks assessed   SENSATION: Light touch: Impaired , able to discern 2-point and temperature difference but notes feeling is asymmetrical as compared to LLE  COORDINATION: WFL, difficulty with rapid, alternating movements RUE> RLE. Jumping/double foot hop: decreased take-off RLE,  right knee instability with landing/loading response    MUSCLE  TONE: RLE: Within functional limits and Hypotonic Right quad   MUSCLE LENGTH: Negative Ely's     POSTURE: No Significant postural limitations  LOWER EXTREMITY ROM:     WFL, able to perform deep squat without compensation  (Blank rows = not tested)  LOWER EXTREMITY MMT:    Grossly 5/5 BLE, some difference in ability to maintain terminal knee extension against max. Manual resistance, able to toe walk/heel walk   BED MOBILITY:  Independent  TRANSFERS: Independent Floor: Modified independence, UE support through LE    STAIRS:  Level of Assistance: Complete Independence  Stair Negotiation Technique: Alternating Pattern  with No Rails  Number of Stairs: 6   Height of Stairs: 4-6"  Comments:   GAIT: Gait pattern: WFL Distance walked: 500+ Assistive device utilized: None Level of assistance: Complete Independence Comments: Running assessment also performed: decreased right hip extension, forefoot initial contact, short stride  FUNCTIONAL TESTs:  Berg Balance Scale: 56/56  PATIENT SURVEYS:  N/A  TODAY'S TREATMENT:  Single leg squat/sit-stand 2x5 Invisible jump rope: 30 sec Assisted squat jump x 5 reps   PATIENT EDUCATION: Education details: reqgarding neuro-muscular "weakness", assessment findings, and applicable exercise prescription Person educated: Patient Education method: Explanation and Demonstration Education comprehension: verbalized understanding   HOME EXERCISE PROGRAM: Access Code: [facility] URL: [email or web address] Date: 01/05/2022 Prepared by: Sherlyn Lees  Exercises - Single Leg Squat with Chair Touch  - 1 x daily - 7 x weekly - 3-5 sets - 5 reps - Jumping Rope  - 1 x daily - 7 x weekly - 3-5 sets - 30 sec rounds hold - Squat Jumps  - 1 x daily - 7 x weekly - 3-5 sets - 5 reps    GOALS: Goals reviewed with patient? Yes  SHORT TERM GOALS: Target date: 01/26/2022  Patient will be independent in HEP to improve functional  outcomes Baseline: Goal status: INITIAL   LONG TERM GOALS: Target date: 02/16/2022  Demonstrate improved landing mechanics during jump/hop Baseline: right knee wobble/hypotonic Goal status: INITIAL  2.  Report 0/10 right knee pain following running exercise to improve activity participation/tolerance Baseline: 3-5/10 right knee pain  Goal status: INITIAL  3.  Demo understanding/ability to progress plyometric training to improve tone/neuromuscular power RLE Baseline:  Goal status: INITIAL  4.  Demo independent floor to stand, no compensation to  improve mobility Baseline: UE support Goal status: INITIAL    ASSESSMENT:  CLINICAL IMPRESSION: Patient is a 41  y.o. man who was seen today for physical therapy evaluation and treatment for mobility and activity participation deficits following hx of CVA with lingering RLE deficits and presenting with some hypotonia involving his RLE which has been manifesting as balance, gait, and running deviations with onset of right knee pain as well causing dysfunction and limited activity.  Patient would benefit from PT services to train and instruct in activties and strategies to improve RLE strength, power, and neuromuscular control to enable participation in leisure and exercise activities with limitation for greater quality of life   OBJECTIVE IMPAIRMENTS Abnormal gait, decreased activity tolerance, decreased coordination, decreased knowledge of condition, impaired tone, and pain.   ACTIVITY LIMITATIONS squatting, stairs, caring for others, and running  PARTICIPATION LIMITATIONS: interpersonal relationship and fitness routine  PERSONAL FACTORS Age and Time since onset of injury/illness/exacerbation are also affecting patient's functional outcome.   REHAB POTENTIAL: Excellent  CLINICAL DECISION MAKING: Stable/uncomplicated  EVALUATION COMPLEXITY: Low  PLAN: PT FREQUENCY:  4 visits  PT DURATION: 8 weeks  PLANNED INTERVENTIONS:  Therapeutic exercises, Therapeutic activity, Neuromuscular re-education, Balance training, Gait training, Patient/Family education, Joint mobilization, Vestibular training, Orthotic/Fit training, Dry Needling, and Electrical stimulation  PLAN FOR NEXT SESSION: assess plyometrics, motor control, progress additional power/plyo   [employer], PT 01/05/2022, 1:02 PM

## 2022-01-18 ENCOUNTER — Ambulatory Visit: Payer: 59

## 2022-01-19 NOTE — Therapy (Signed)
OUTPATIENT PHYSICAL THERAPY NEURO TREATMENT   Patient Name: Frank Cummings MRN: [id number] DOB:[birth year], 41 y.o., male Today's Date: 01/20/2022   PCP: Farris Has, MD REFERRING PROVIDER: Ihor Austin, NP   PT End of Session - 01/20/22 1014     Visit Number 2    Number of Visits 4    Date for PT Re-Evaluation 03/02/22    Authorization Type [employer]    PT Start Time 0940   pt late   PT Stop Time 1015    PT Time Calculation (min) 35 min    Activity Tolerance Patient tolerated treatment well    Behavior During Therapy [facility] for tasks assessed/performed              Past Medical History:  Diagnosis Date   Acute ischemic stroke (HCC) 07/15/2020   Arthritis    gout   Bell's palsy    Depression    Elevated ALT measurement 07/17/2020   Headache    High cholesterol    Hypertriglyceridemia 07/16/2020   Thrombocytopenia (HCC) 07/17/2020   Past Surgical History:  Procedure Laterality Date   ENDARTERECTOMY Left 07/29/2020   Procedure: ENDARTERECTOMY CAROTID LEFT;  Surgeon: Cephus Shelling, MD;  Location: [facility] OR;  Service: Vascular;  Laterality: Left;   TEE WITHOUT CARDIOVERSION N/A 07/17/2020   Procedure: TRANSESOPHAGEAL ECHOCARDIOGRAM (TEE);  Surgeon: Dolores Patty, MD;  Location: [facility] ENDOSCOPY;  Service: Cardiovascular;  Laterality: N/A;   Patient Active Problem List   Diagnosis Date Noted   Skin nodule 05/20/2021   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) 07/29/2020   Thrombocytopenia (HCC) 07/17/2020   Elevated ALT measurement 07/17/2020   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   Hyperlipemia 07/16/2020   Hypertriglyceridemia 07/16/2020   CVA (cerebral vascular accident) (HCC) 07/16/2020   Right sided numbness 07/15/2020   Acute ischemic stroke (HCC) 07/15/2020    ONSET DATE: 07/15/20  REFERRING DIAG: R42.706,C37.6 (ICD-10-CM) - Gait disturbance, post-stroke G81.91 (ICD-10-CM) - Right hemiparesis (HCC) M25.561  (ICD-10-CM) - Right anterior knee pain I63.81 (ICD-10-CM) - Basal ganglia stroke (HCC)   THERAPY DIAG:  Difficulty in walking, not elsewhere classified  Unsteadiness on feet  Other abnormalities of gait and mobility  Other symptoms and signs involving the musculoskeletal system  Rationale for Evaluation and Treatment Rehabilitation  SUBJECTIVE:                                                                                                                                                                                              SUBJECTIVE STATEMENT: HEP was a little difficult Pt accompanied by: self  PERTINENT HISTORY:  hx of CVA 07/2020  PAIN:  Are you having pain? No  PRECAUTIONS: None  PATIENT GOALS be able to fast walk, run, get up from the floor  OBJECTIVE:     TODAY'S TREATMENT: 01/20/22 Activity Comments  single leg squat w/ chair touch 2x10 On elevated mat; occasional heel touch for balance   jumping rope 2x20" Without jump rope; cues to use B feet symmetrically    squat jumps 3x5 Cueing for proper arm swing  lateral and ant/pos hop over threshold Started slow and increased pace as able while trying to perform symmetrical push off; cues for arm swing   Jumping jacks normal and alternating arms 3x15 Trouble coordinating movement, requiring demo and cues  Tandem walk   R SLS + multidirectional toe reach  C/o foot/calf fatigue  Tandem walk fwd/back Good stability   step downs + heel touch on 6" step 3x10 1 set with UE support, 1 set without; knee instability evident    PATIENT EDUCATION: Education details: HEP update Person educated: Patient Education method: Explanation, Demonstration, Tactile cues, Verbal cues, and Handouts Education comprehension: verbalized understanding and returned demonstration  HEP as of 01/20/22: Access Code: [email or web address] URL: [email or web address] Date: 01/20/2022 Prepared by: [facility] - Outpatient  Rehab - [facility]  Exercises - Single Leg Squat with Chair Touch  - 1 x daily - 5 x weekly - 2 sets - 10 reps - Jumping Rope  - 1 x daily - 5 x weekly - 2 sets - 30 sec hold - Squat Jumps  - 1 x daily - 5 x weekly - 2 sets - 30 sec hold - Forward Tape Jumps  - 1 x daily - 5 x weekly - 2 sets - 30 sec hold - Jumping Jacks  - 1 x daily - 5 x weekly - 2 sets - 1 reps - Forward Step Down  - 1 x daily - 5 x weekly - 2-3 sets - 10 reps  Below measures were taken at time of initial evaluation unless otherwise specified:   COGNITION: Overall cognitive status: Within functional limits for tasks assessed   SENSATION: Light touch: Impaired , able to discern 2-point and temperature difference but notes feeling is asymmetrical as compared to LLE  COORDINATION: WFL, difficulty with rapid, alternating movements RUE> RLE. Jumping/double foot hop: decreased take-off RLE,  right knee instability with landing/loading response    MUSCLE TONE: RLE: Within functional limits and Hypotonic Right quad   MUSCLE LENGTH: Negative Ely's     POSTURE: No Significant postural limitations  LOWER EXTREMITY ROM:     WFL, able to perform deep squat without compensation  (Blank rows = not tested)  LOWER EXTREMITY MMT:    Grossly 5/5 BLE, some difference in ability to maintain terminal knee extension against max. Manual resistance, able to toe walk/heel walk   BED MOBILITY:  Independent  TRANSFERS: Independent Floor: Modified independence, UE support through LE    STAIRS:  Level of Assistance: Complete Independence  Stair Negotiation Technique: Alternating Pattern  with No Rails  Number of Stairs: 6   Height of Stairs: 4-6"  Comments:   GAIT: Gait pattern: WFL Distance walked: 500+ Assistive device utilized: None Level of assistance: Complete Independence Comments: Running assessment also performed: decreased right hip extension, forefoot initial contact, short stride  FUNCTIONAL TESTs:  Berg  Balance Scale: 56/56  PATIENT SURVEYS:  N/A  TODAY'S TREATMENT:  Single leg squat/sit-stand 2x5 Invisible jump rope: 30 sec Assisted squat jump x 5 reps  PATIENT EDUCATION: Education details: reqgarding neuro-muscular "weakness", assessment findings, and applicable exercise prescription Person educated: Patient Education method: Explanation and Demonstration Education comprehension: verbalized understanding   HOME EXERCISE PROGRAM: [facility]: [email or web address] URL: [email or web address] Date: 01/05/2022 Prepared by: Shary DecampKelly Halpin  Exercises - Single Leg Squat with Chair Touch  - 1 x daily - 7 x weekly - 3-5 sets - 5 reps - Jumping Rope  - 1 x daily - 7 x weekly - 3-5 sets - 30 sec rounds hold - Squat Jumps  - 1 x daily - 7 x weekly - 3-5 sets - 5 reps    GOALS: Goals reviewed with patient? Yes  SHORT TERM GOALS: Target date: 01/26/2022  Patient will be independent in HEP to improve functional outcomes Baseline: Goal status: IN PROGRESS   LONG TERM GOALS: Target date: 02/16/2022  Demonstrate improved landing mechanics during jump/hop Baseline: right knee wobble/hypotonic Goal status: IN PROGRESS  2.  Report 0/10 right knee pain following running exercise to improve activity participation/tolerance Baseline: 3-5/10 right knee pain  Goal status: IN PROGRESS  3.  Demo understanding/ability to progress plyometric training to improve tone/neuromuscular power RLE Baseline:  Goal status: IN PROGRESS  4.  Demo independent floor to stand, no compensation to improve mobility Baseline: UE support Goal status: IN PROGRESS    ASSESSMENT:  CLINICAL IMPRESSION: Patient arrived to session with report of HEP compliance. Reviewed previously administered HEP and provided cues to proper set up and use of LEs equally. Worked on plyometrics which patient tolerated well. Did demonstrate some trouble coordinating arm swing and arm movement. R knee instability evident with  step downs, suggesting weakness. HEP was updated with exercises that were well-tolerated. No complaints at end of session.    OBJECTIVE IMPAIRMENTS Abnormal gait, decreased activity tolerance, decreased coordination, decreased knowledge of condition, impaired tone, and pain.   ACTIVITY LIMITATIONS squatting, stairs, caring for others, and running  PARTICIPATION LIMITATIONS: interpersonal relationship and fitness routine  PERSONAL FACTORS Age and Time since onset of injury/illness/exacerbation are also affecting patient's functional outcome.   REHAB POTENTIAL: Excellent  CLINICAL DECISION MAKING: Stable/uncomplicated  EVALUATION COMPLEXITY: Low  PLAN: PT FREQUENCY:  4 visits  PT DURATION: 8 weeks  PLANNED INTERVENTIONS: Therapeutic exercises, Therapeutic activity, Neuromuscular re-education, Balance training, Gait training, Patient/Family education, Joint mobilization, Vestibular training, Orthotic/Fit training, Dry Needling, and Electrical stimulation  PLAN FOR NEXT SESSION: assess plyometrics, motor control, progress additional power/plyo   Anette GuarneriYevgeniya Poseidon Pam, PT, DPT 01/20/22 10:17 AM  [facility] [address], [address] [place], [place] [address] Phone # [phone] Fax # [phone]

## 2022-01-20 ENCOUNTER — Ambulatory Visit: Payer: 59 | Attending: Adult Health | Admitting: Physical Therapy

## 2022-01-20 ENCOUNTER — Encounter: Payer: Self-pay | Admitting: Physical Therapy

## 2022-01-20 DIAGNOSIS — R2681 Unsteadiness on feet: Secondary | ICD-10-CM | POA: Insufficient documentation

## 2022-01-20 DIAGNOSIS — R262 Difficulty in walking, not elsewhere classified: Secondary | ICD-10-CM | POA: Diagnosis present

## 2022-01-20 DIAGNOSIS — R29898 Other symptoms and signs involving the musculoskeletal system: Secondary | ICD-10-CM | POA: Insufficient documentation

## 2022-01-20 DIAGNOSIS — R2689 Other abnormalities of gait and mobility: Secondary | ICD-10-CM | POA: Insufficient documentation

## 2022-02-15 ENCOUNTER — Ambulatory Visit: Payer: 59 | Attending: Adult Health

## 2022-02-15 DIAGNOSIS — R29898 Other symptoms and signs involving the musculoskeletal system: Secondary | ICD-10-CM | POA: Insufficient documentation

## 2022-02-15 DIAGNOSIS — R262 Difficulty in walking, not elsewhere classified: Secondary | ICD-10-CM | POA: Diagnosis not present

## 2022-02-15 DIAGNOSIS — R2689 Other abnormalities of gait and mobility: Secondary | ICD-10-CM | POA: Diagnosis present

## 2022-02-15 DIAGNOSIS — R2681 Unsteadiness on feet: Secondary | ICD-10-CM | POA: Insufficient documentation

## 2022-02-15 NOTE — Therapy (Signed)
OUTPATIENT PHYSICAL THERAPY NEURO TREATMENT   Patient Name: Frank Cummings MRN: [id number] DOB:1980/10/20, 41 y.o., male Today's Date: 02/15/2022   PCP: Farris Has, MD REFERRING PROVIDER: Ihor Austin, NP   PT End of Session - 02/15/22 1611     Visit Number 3    Number of Visits 4    Date for PT Re-Evaluation 03/02/22    Authorization Type [employer]    PT Start Time 1615    PT Stop Time 1700    PT Time Calculation (min) 45 min    Activity Tolerance Patient tolerated treatment well    Behavior During Therapy [facility] for tasks assessed/performed              Past Medical History:  Diagnosis Date   Acute ischemic stroke (HCC) 07/15/2020   Arthritis    gout   Bell's palsy    Depression    Elevated ALT measurement 07/17/2020   Headache    High cholesterol    Hypertriglyceridemia 07/16/2020   Thrombocytopenia (HCC) 07/17/2020   Past Surgical History:  Procedure Laterality Date   ENDARTERECTOMY Left 07/29/2020   Procedure: ENDARTERECTOMY CAROTID LEFT;  Surgeon: Cephus Shelling, MD;  Location: [facility] OR;  Service: Vascular;  Laterality: Left;   TEE WITHOUT CARDIOVERSION N/A 07/17/2020   Procedure: TRANSESOPHAGEAL ECHOCARDIOGRAM (TEE);  Surgeon: Dolores Patty, MD;  Location: [facility] ENDOSCOPY;  Service: Cardiovascular;  Laterality: N/A;   Patient Active Problem List   Diagnosis Date Noted   Skin nodule 05/20/2021   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) 07/29/2020   Thrombocytopenia (HCC) 07/17/2020   Elevated ALT measurement 07/17/2020   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   Hyperlipemia 07/16/2020   Hypertriglyceridemia 07/16/2020   CVA (cerebral vascular accident) (HCC) 07/16/2020   Right sided numbness 07/15/2020   Acute ischemic stroke (HCC) 07/15/2020    ONSET DATE: 07/15/20  REFERRING DIAG: T06.269,S85.4 (ICD-10-CM) - Gait disturbance, post-stroke G81.91 (ICD-10-CM) - Right hemiparesis (HCC) M25.561 (ICD-10-CM) -  Right anterior knee pain I63.81 (ICD-10-CM) - Basal ganglia stroke (HCC)   THERAPY DIAG:  Difficulty in walking, not elsewhere classified  Unsteadiness on feet  Other abnormalities of gait and mobility  Other symptoms and signs involving the musculoskeletal system  Rationale for Evaluation and Treatment Rehabilitation  SUBJECTIVE: Vacation for past 2 weeks and not much activity/exercise                                                                                                                                                                                              Pt accompanied by: self  PERTINENT HISTORY: hx  of CVA 07/2020  PAIN:  Are you having pain? No  PRECAUTIONS: None  PATIENT GOALS be able to fast walk, run, get up from the floor  OBJECTIVE:   TODAY'S TREATMENT: 02/15/22 Activity Comments  Single leg squat w/ chair touch 3x10 On elevated mat; occasional heel touch for balance ; cues for monitoring right knee wobble  Invisible jump rope   Forward/backward hops 2x10   Skater hops 2x10   Step downs 2x10, 4" box, cues for eccentric control  R SLS + multidirectional toe reach    Jumping jacks 3x30 sec           PATIENT EDUCATION: Education details: HEP update Person educated: Patient Education method: Explanation, Demonstration, Tactile cues, Verbal cues, and Handouts Education comprehension: verbalized understanding and returned demonstration  HEP as of 01/20/22: Access Code: [email or web address] URL: [email or web address] Date: 01/20/2022 Prepared by: [facility] - Outpatient  Rehab - [facility]  Exercises - Single Leg Squat with Chair Touch  - 1 x daily - 5 x weekly - 2 sets - 10 reps - Jumping Rope  - 1 x daily - 5 x weekly - 2 sets - 30 sec hold - Squat Jumps  - 1 x daily - 5 x weekly - 2 sets - 30 sec hold - Forward Tape Jumps  - 1 x daily - 5 x weekly - 2 sets - 30 sec hold - Jumping Jacks  - 1 x daily - 5 x weekly - 2 sets - 1  reps - Forward Step Down  - 1 x daily - 5 x weekly - 2-3 sets - 10 reps  Below measures were taken at time of initial evaluation unless otherwise specified:   COGNITION: Overall cognitive status: Within functional limits for tasks assessed   SENSATION: Light touch: Impaired , able to discern 2-point and temperature difference but notes feeling is asymmetrical as compared to LLE  COORDINATION: WFL, difficulty with rapid, alternating movements RUE> RLE. Jumping/double foot hop: decreased take-off RLE,  right knee instability with landing/loading response    MUSCLE TONE: RLE: Within functional limits and Hypotonic Right quad   MUSCLE LENGTH: Negative Ely's     POSTURE: No Significant postural limitations  LOWER EXTREMITY ROM:     WFL, able to perform deep squat without compensation  (Blank rows = not tested)  LOWER EXTREMITY MMT:    Grossly 5/5 BLE, some difference in ability to maintain terminal knee extension against max. Manual resistance, able to toe walk/heel walk   BED MOBILITY:  Independent  TRANSFERS: Independent Floor: Modified independence, UE support through LE    STAIRS:  Level of Assistance: Complete Independence  Stair Negotiation Technique: Alternating Pattern  with No Rails  Number of Stairs: 6   Height of Stairs: 4-6"  Comments:   GAIT: Gait pattern: WFL Distance walked: 500+ Assistive device utilized: None Level of assistance: Complete Independence Comments: Running assessment also performed: decreased right hip extension, forefoot initial contact, short stride  FUNCTIONAL TESTs:  Berg Balance Scale: 56/56  PATIENT SURVEYS:  N/A  TODAY'S TREATMENT:  Single leg squat/sit-stand 2x5 Invisible jump rope: 30 sec Assisted squat jump x 5 reps   PATIENT EDUCATION: Education details: reqgarding neuro-muscular "weakness", assessment findings, and applicable exercise prescription Person educated: Patient Education method: Explanation and  Demonstration Education comprehension: verbalized understanding   HOME EXERCISE PROGRAM: Access Code: [facility] URL: [email or web address] Date: 01/05/2022 Prepared by: Shary Decamp  Exercises - Single Leg Squat with Chair Touch  - 1 x daily - 7  x weekly - 3-5 sets - 5 reps - Jumping Rope  - 1 x daily - 7 x weekly - 3-5 sets - 30 sec rounds hold - Squat Jumps  - 1 x daily - 7 x weekly - 3-5 sets - 5 reps Lateral Single Leg Lunge Jumps   GOALS: Goals reviewed with patient? Yes  SHORT TERM GOALS: Target date: 01/26/2022  Patient will be independent in HEP to improve functional outcomes Baseline: Goal status: IN PROGRESS   LONG TERM GOALS: Target date: 02/16/2022  Demonstrate improved landing mechanics during jump/hop Baseline: right knee wobble/hypotonic Goal status: IN PROGRESS  2.  Report 0/10 right knee pain following running exercise to improve activity participation/tolerance Baseline: 3-5/10 right knee pain  Goal status: IN PROGRESS  3.  Demo understanding/ability to progress plyometric training to improve tone/neuromuscular power RLE Baseline:  Goal status: IN PROGRESS  4.  Demo independent floor to stand, no compensation to improve mobility Baseline: UE support Goal status: IN PROGRESS    ASSESSMENT:  CLINICAL IMPRESSION: Demonstrates continued right knee weakness as evident by lateral wobble during single limb loading activities and pt reports feeling of fatigue in right quad vs left when performing these single leg power/strength activities.  Progressing quite well with plyometric activities and demonstrating improved coordination and power with jumping/lateral movements with increasing control for acceleration/deceleration. Continued session x 1 visit to refine HEP and train in sequencing HEP for circuit training.   OBJECTIVE IMPAIRMENTS Abnormal gait, decreased activity tolerance, decreased coordination, decreased knowledge of condition,  impaired tone, and pain.   ACTIVITY LIMITATIONS squatting, stairs, caring for others, and running  PARTICIPATION LIMITATIONS: interpersonal relationship and fitness routine  PERSONAL FACTORS Age and Time since onset of injury/illness/exacerbation are also affecting patient's functional outcome.   REHAB POTENTIAL: Excellent  CLINICAL DECISION MAKING: Stable/uncomplicated  EVALUATION COMPLEXITY: Low  PLAN: PT FREQUENCY:  4 visits  PT DURATION: 8 weeks  PLANNED INTERVENTIONS: Therapeutic exercises, Therapeutic activity, Neuromuscular re-education, Balance training, Gait training, Patient/Family education, Joint mobilization, Vestibular training, Orthotic/Fit training, Dry Needling, and Electrical stimulation  PLAN FOR NEXT SESSION: assess goals, take HEP activities and format as circuit routine   4:57 PM, 02/15/22 M. Shary Decamp, PT, DPT Physical Therapist- [place] Office Number: [phone]   [facility] at [facility] [address], [address] [place], [place] [address] Phone # [phone] Fax # [phone]

## 2022-03-01 ENCOUNTER — Ambulatory Visit: Payer: 59

## 2022-03-01 DIAGNOSIS — R262 Difficulty in walking, not elsewhere classified: Secondary | ICD-10-CM | POA: Diagnosis not present

## 2022-03-01 DIAGNOSIS — R2681 Unsteadiness on feet: Secondary | ICD-10-CM

## 2022-03-01 DIAGNOSIS — R2689 Other abnormalities of gait and mobility: Secondary | ICD-10-CM

## 2022-03-01 DIAGNOSIS — R29898 Other symptoms and signs involving the musculoskeletal system: Secondary | ICD-10-CM

## 2022-03-01 NOTE — Therapy (Signed)
OUTPATIENT PHYSICAL THERAPY NEURO TREATMENT and D/C Summary   Patient Name: Frank Cummings MRN: [id number] DOB:March 02, 1981, 41 y.o., male Today's Date: 03/01/2022   PCP: London Pepper, MD REFERRING PROVIDER: Frann Rider, NP PHYSICAL THERAPY DISCHARGE SUMMARY  Visits from Start of Care: 4  Current functional level related to goals / functional outcomes: Met all STG/LTG   Remaining deficits: none   Education / Equipment: HEP and references/resources for progression   Patient agrees to discharge. Patient goals were met. Patient is being discharged due to meeting the stated rehab goals.   PT End of Session - 03/01/22 1541     Visit Number 4    Number of Visits 4    Date for PT Re-Evaluation 03/02/22    Authorization Type [employer]    PT Start Time 5945    PT Stop Time 8592    PT Time Calculation (min) 40 min    Activity Tolerance Patient tolerated treatment well    Behavior During Therapy WFL for tasks assessed/performed              Past Medical History:  Diagnosis Date   Acute ischemic stroke ([place]) 07/15/2020   Arthritis    gout   Bell's palsy    Depression    Elevated ALT measurement 07/17/2020   Headache    High cholesterol    Hypertriglyceridemia 07/16/2020   Thrombocytopenia ([place]) 07/17/2020   Past Surgical History:  Procedure Laterality Date   ENDARTERECTOMY Left 07/29/2020   Procedure: ENDARTERECTOMY CAROTID LEFT;  Surgeon: Marty Heck, MD;  Location: [place];  Service: Vascular;  Laterality: Left;   TEE WITHOUT CARDIOVERSION N/A 07/17/2020   Procedure: TRANSESOPHAGEAL ECHOCARDIOGRAM (TEE);  Surgeon: Jolaine Artist, MD;  Location: [facility] ENDOSCOPY;  Service: Cardiovascular;  Laterality: N/A;   Patient Active Problem List   Diagnosis Date Noted   Skin nodule 05/20/2021   Stenosis of left internal carotid artery with cerebral infarction ([place]) 07/29/2020   Thrombocytopenia ([place]) 07/17/2020   Elevated ALT measurement 07/17/2020   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   Hyperlipemia 07/16/2020   Hypertriglyceridemia 07/16/2020   CVA (cerebral vascular accident) ([place]) 07/16/2020   Right sided numbness 07/15/2020   Acute ischemic stroke ([place]) 07/15/2020    ONSET DATE: 07/15/20  REFERRING DIAG: T24.462,M63.8 (ICD-10-CM) - Gait disturbance, post-stroke G81.91 (ICD-10-CM) - Right hemiparesis (HCC) M25.561 (ICD-10-CM) - Right anterior knee pain I63.81 (ICD-10-CM) - Basal ganglia stroke (HCC)   THERAPY DIAG:  Difficulty in walking, not elsewhere classified  Unsteadiness on feet  Other abnormalities of gait and mobility  Other symptoms and signs involving the musculoskeletal system  Rationale for Evaluation and Treatment Rehabilitation  SUBJECTIVE: Able to run some without knee pain  Pt accompanied by: self  PERTINENT HISTORY: hx of CVA 07/2020  PAIN:  Are you having pain? No  PRECAUTIONS: None  PATIENT GOALS be able to fast walk, run, get up from the floor  OBJECTIVE:   TODAY'S TREATMENT: 03/01/22 Activity Comments  Circuit training x 10 min Jump rope x 10, step-downs x 10, lateral hop x5, jumping jack x 10  Education on fitness resources for self-progression Emphasis on plyometrics and calisthenics for beginners                      PATIENT EDUCATION: Education details: HEP update Person educated: Patient Education method: Consulting civil engineer, Demonstration, Tactile cues, Verbal cues, and Handouts Education comprehension: verbalized understanding and returned demonstration  HEP as of 01/20/22: Access Code: [email or web address] URL: [email or web address] Date: 01/20/2022 Prepared by: [facility]  Exercises - Single Leg Squat with Chair Touch  - 1 x daily - 5 x weekly - 2 sets -  10 reps - Jumping Rope  - 1 x daily - 5 x weekly - 2 sets - 30 sec hold - Squat Jumps  - 1 x daily - 5 x weekly - 2 sets - 30 sec hold - Forward Tape Jumps  - 1 x daily - 5 x weekly - 2 sets - 30 sec hold - Jumping Jacks  - 1 x daily - 5 x weekly - 2 sets - 1 reps - Forward Step Down  - 1 x daily - 5 x weekly - 2-3 sets - 10 reps  Below measures were taken at time of initial evaluation unless otherwise specified:   COGNITION: Overall cognitive status: Within functional limits for tasks assessed   SENSATION: Light touch: Impaired , able to discern 2-point and temperature difference but notes feeling is asymmetrical as compared to LLE  COORDINATION: WFL, difficulty with rapid, alternating movements RUE> RLE. Jumping/double foot hop: decreased take-off RLE,  right knee instability with landing/loading response    MUSCLE TONE: RLE: Within functional limits and Hypotonic Right quad   MUSCLE LENGTH: Negative Ely's     POSTURE: No Significant postural limitations  LOWER EXTREMITY ROM:     WFL, able to perform deep squat without compensation  (Blank rows = not tested)  LOWER EXTREMITY MMT:    Grossly 5/5 BLE, some difference in ability to maintain terminal knee extension against max. Manual resistance, able to toe walk/heel walk   BED MOBILITY:  Independent  TRANSFERS: Independent Floor: Modified independence, UE support through LE    STAIRS:  Level of Assistance: Complete Independence  Stair Negotiation Technique: Alternating Pattern  with No Rails  Number of Stairs: 6   Height of Stairs: 4-6"  Comments:   GAIT: Gait pattern: WFL Distance walked: 500+ Assistive device utilized: None Level of assistance: Complete Independence Comments: Running assessment also performed: decreased right hip extension, forefoot initial contact, short stride  FUNCTIONAL TESTs:  Berg Balance Scale: 56/56  PATIENT SURVEYS:  N/A  TODAY'S TREATMENT:  Single leg squat/sit-stand  2x5 Invisible jump rope: 30 sec Assisted squat jump x 5 reps   PATIENT EDUCATION: Education details: reqgarding neuro-muscular "weakness", assessment findings, and applicable exercise prescription Person educated: Patient Education method: Explanation and Demonstration Education comprehension: verbalized understanding   HOME EXERCISE PROGRAM: Access Code: [facility] URL: [email or web address] Date: 01/05/2022 Prepared by: Sherlyn Lees  Exercises - Single Leg Squat with Chair Touch  - 1 x daily - 7 x weekly - 3-5 sets - 5 reps -  Jumping Rope  - 1 x daily - 7 x weekly - 3-5 sets - 30 sec rounds hold - Squat Jumps  - 1 x daily - 7 x weekly - 3-5 sets - 5 reps Lateral Single Leg Lunge Jumps   GOALS: Goals reviewed with patient? Yes  SHORT TERM GOALS: Target date: 01/26/2022  Patient will be independent in HEP to improve functional outcomes Baseline: Goal status: MET   LONG TERM GOALS: Target date: 02/16/2022  Demonstrate improved landing mechanics during jump/hop Baseline: right knee wobble/hypotonic Goal status: MET  2.  Report 0/10 right knee pain following running exercise to improve activity participation/tolerance Baseline: 0/10 knee pain, able to run short distance Goal status: MET  3.  Demo understanding/ability to progress plyometric training to improve tone/neuromuscular power RLE Baseline:  Goal status: MET  4.  Demo independent floor to stand, no compensation to improve mobility Baseline: UE support Goal status: MET    ASSESSMENT:  CLINICAL IMPRESSION: Able to perform plyo and strength circuit x 10 min with good performance, power, and coordination.  Pt reports knee pain as abated and he has been able to resume typical activities.  Will D/C to HEP at this time   OBJECTIVE IMPAIRMENTS Abnormal gait, decreased activity tolerance, decreased coordination, decreased knowledge of condition, impaired tone, and pain.   ACTIVITY LIMITATIONS  squatting, stairs, caring for others, and running  PARTICIPATION LIMITATIONS: interpersonal relationship and fitness routine  PERSONAL FACTORS Age and Time since onset of injury/illness/exacerbation are also affecting patient's functional outcome.   REHAB POTENTIAL: Excellent  CLINICAL DECISION MAKING: Stable/uncomplicated  EVALUATION COMPLEXITY: Low  PLAN: PT FREQUENCY:  4 visits  PT DURATION: 8 weeks  PLANNED INTERVENTIONS: Therapeutic exercises, Therapeutic activity, Neuromuscular re-education, Balance training, Gait training, Patient/Family education, Joint mobilization, Vestibular training, Orthotic/Fit training, Dry Needling, and Electrical stimulation  PLAN FOR NEXT SESSION: assess goals, take HEP activities and format as circuit routine   3:41 PM, 03/01/22 M. Sherlyn Lees, PT, DPT Physical Therapist- [place] Office Number: [phone]   [place] [facility] [address], [place], [place] [address] Phone # [phone] Fax # [phone]

## 2022-09-23 ENCOUNTER — Other Ambulatory Visit: Payer: Self-pay

## 2022-09-23 ENCOUNTER — Inpatient Hospital Stay: Payer: 59 | Attending: Hematology and Oncology

## 2022-09-23 ENCOUNTER — Inpatient Hospital Stay (HOSPITAL_BASED_OUTPATIENT_CLINIC_OR_DEPARTMENT_OTHER): Payer: 59 | Admitting: Hematology and Oncology

## 2022-09-23 VITALS — BP 128/85 | HR 67 | Temp 97.8°F | Resp 16 | Ht 62.0 in | Wt 156.9 lb

## 2022-09-23 DIAGNOSIS — Z8673 Personal history of transient ischemic attack (TIA), and cerebral infarction without residual deficits: Secondary | ICD-10-CM | POA: Insufficient documentation

## 2022-09-23 DIAGNOSIS — D696 Thrombocytopenia, unspecified: Secondary | ICD-10-CM

## 2022-09-23 DIAGNOSIS — Z87891 Personal history of nicotine dependence: Secondary | ICD-10-CM | POA: Insufficient documentation

## 2022-09-23 DIAGNOSIS — Z809 Family history of malignant neoplasm, unspecified: Secondary | ICD-10-CM | POA: Insufficient documentation

## 2022-09-23 LAB — CBC WITH DIFFERENTIAL/PLATELET
Abs Immature Granulocytes: 0.02 10*3/uL (ref 0.00–0.07)
Basophils Absolute: 0.1 10*3/uL (ref 0.0–0.1)
Basophils Relative: 1 %
Eosinophils Absolute: 0.2 10*3/uL (ref 0.0–0.5)
Eosinophils Relative: 3 %
HCT: 46.9 % (ref 39.0–52.0)
Hemoglobin: 16.4 g/dL (ref 13.0–17.0)
Immature Granulocytes: 0 %
Lymphocytes Relative: 24 %
Lymphs Abs: 1.8 10*3/uL (ref 0.7–4.0)
MCH: 29 pg (ref 26.0–34.0)
MCHC: 35 g/dL (ref 30.0–36.0)
MCV: 83 fL (ref 80.0–100.0)
Monocytes Absolute: 0.3 10*3/uL (ref 0.1–1.0)
Monocytes Relative: 5 %
Neutro Abs: 4.9 10*3/uL (ref 1.7–7.7)
Neutrophils Relative %: 67 %
Platelets: 112 10*3/uL — ABNORMAL LOW (ref 150–400)
RBC: 5.65 MIL/uL (ref 4.22–5.81)
RDW: 13.9 % (ref 11.5–15.5)
WBC: 7.4 10*3/uL (ref 4.0–10.5)
nRBC: 0 % (ref 0.0–0.2)

## 2022-09-23 NOTE — Progress Notes (Signed)
[facility] NOTE  Patient Care Team: London Pepper, MD as PCP - General (Family Medicine)  CHIEF COMPLAINTS/PURPOSE OF CONSULTATION:  Thrombocytopenia.  ASSESSMENT & PLAN:    Expand All Collapse All  [place] NOTE   Patient Care Team: London Pepper, MD as PCP - General (Family Medicine)   CHIEF COMPLAINTS/PURPOSE OF CONSULTATION:  Thrombocytopenia.   ASSESSMENT & PLAN:    Thrombocytopenia ([place]) This is a very pleasant 42 year old male patient with past medical history significant for acute ischemic stroke, carotid stenosis, hypertriglyceridemia, dyslipidemia referred to hematology for evaluation of thrombocytopenia. Patient had chronic thrombocytopenia at least on my review of from 2010.  His platelet count usually ranges from 80-110,000.  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  No new concerns on ROS PE findings unremarkable today Labs today pending He can follow up with [place] annually since this has remained stable overall unless there is any significant change today He was instructed to call us sooner with any new concerns or questions.       HISTORY OF PRESENTING ILLNESS:   Frank Cummings 42 y.o. male is here because of Thrombocytopenia.  This is a very pleasant 42 year old male patient with past medical history significant for acute ischemic stroke, Bell's palsy, gouty arthritis, high cholesterol, chronic thrombocytopenia who has recently moved from [facility] to [place] and is being referred to hematology for follow-up and recommendations.  Interval History  Patient is here for a follow-up.  Since last visit, he has been doing okay.  He has recovered from stroke completely. No fevers, drenching night sweats,no loss of appetite. No recent infections or recent hospitalizations.  Rest of the pertinent 10 point ROS reviewed  and negative.  REVIEW OF SYSTEMS:   Constitutional: Denies fevers, chills or abnormal night sweats Eyes: Denies blurriness of vision, double vision or watery eyes Ears, nose, mouth, throat, and face: Denies mucositis or sore throat Respiratory: Denies cough, dyspnea or wheezes Cardiovascular: Denies palpitation, chest discomfort or lower extremity swelling Gastrointestinal:  Denies nausea, heartburn or change in bowel habits Skin: Denies abnormal skin rashes Lymphatics: Denies new lymphadenopathy or easy bruising Neurological:Denies numbness, tingling or new weaknesses Behavioral/Psych: Mood is stable, no new changes  All other systems were reviewed with the patient and are negative.  MEDICAL HISTORY:  Past Medical History:  Diagnosis Date   Acute ischemic stroke ([place]) 07/15/2020   Arthritis    gout   Bell's palsy    Depression    Elevated ALT measurement 07/17/2020   Headache    High cholesterol    Hypertriglyceridemia 07/16/2020   Thrombocytopenia ([place]) 07/17/2020    SURGICAL HISTORY: Past Surgical History:  Procedure Laterality Date   ENDARTERECTOMY Left 07/29/2020   Procedure: ENDARTERECTOMY CAROTID LEFT;  Surgeon: Marty Heck, MD;  Location: [facility];  Service: Vascular;  Laterality: Left;   TEE WITHOUT CARDIOVERSION N/A 07/17/2020   Procedure: TRANSESOPHAGEAL ECHOCARDIOGRAM (TEE);  Surgeon: Jolaine Artist, MD;  Location: [facility] ENDOSCOPY;  Service: Cardiovascular;  Laterality: N/A;    SOCIAL HISTORY: Social History   Socioeconomic History   Marital status: Single    Spouse name: Not on file   Number of children: Not on file   Years of education: Not on file   Highest education level: Not on file  Occupational History   Not on file  Tobacco Use   Smoking status: Former  Types: Cigarettes    Quit date: 07/15/2020    Years since quitting: 2.1   Smokeless tobacco: Never  Substance and Sexual Activity   Alcohol use: Yes    Comment: occasional   Drug  use: Never   Sexual activity: Not Currently  Other Topics Concern   Not on file  Social History Narrative   Not on file   Social Determinants of Health   Financial Resource Strain: Not on file  Food Insecurity: Not on file  Transportation Needs: Not on file  Physical Activity: Not on file  Stress: Not on file  Social Connections: Not on file  Intimate Partner Violence: Not on file    FAMILY HISTORY: Family History  Problem Relation Age of Onset   Cancer Mother    Hypertension Father     ALLERGIES:  has No Known Allergies.  MEDICATIONS:  Current Outpatient Medications  Medication Sig Dispense Refill   allopurinol (ZYLOPRIM) 100 MG tablet Take 200 mg by mouth daily.     aspirin EC 81 MG EC tablet Take 1 tablet (81 mg total) by mouth daily. Swallow whole.     atorvastatin (LIPITOR) 40 MG tablet Take 1 tablet (40 mg total) by mouth daily. 30 tablet 2   No current facility-administered medications for this visit.    PHYSICAL EXAMINATION: ECOG PERFORMANCE STATUS: 0 - Asymptomatic  Vitals:   09/23/22 0915  BP: 128/85  Pulse: 67  Resp: 16  Temp: 97.8 F (36.6 C)  SpO2: 99%    Filed Weights   09/23/22 0915  Weight: 156 lb 14.4 oz (71.2 kg)    Physical Exam Constitutional:      Appearance: Normal appearance.  HENT:     Head: Normocephalic.  Neck:     Vascular: No carotid bruit.  Cardiovascular:     Rate and Rhythm: Normal rate and regular rhythm.     Pulses: Normal pulses.     Heart sounds: Normal heart sounds.  Pulmonary:     Effort: Pulmonary effort is normal.     Breath sounds: Normal breath sounds.  Abdominal:     General: Abdomen is flat. Bowel sounds are normal.     Palpations: Abdomen is soft.  Musculoskeletal:        General: Normal range of motion.     Cervical back: Normal range of motion and neck supple.  Lymphadenopathy:     Cervical: No cervical adenopathy.  Skin:    General: Skin is warm and dry.  Neurological:     General: No focal  deficit present.     Mental Status: He is alert.  Psychiatric:        Mood and Affect: Mood normal.      LABORATORY DATA:  I have reviewed the data as listed Lab Results  Component Value Date   WBC 7.8 09/23/2021   HGB 16.1 09/23/2021   HCT 46.9 09/23/2021   MCV 83.6 09/23/2021   PLT 104 (L) 09/23/2021     Chemistry      Component Value Date/Time   NA 142 12/23/2020 1010   K 4.0 12/23/2020 1010   CL 104 12/23/2020 1010   CO2 28 12/23/2020 1010   BUN 12 12/23/2020 1010   CREATININE 0.93 12/23/2020 1010      Component Value Date/Time   CALCIUM 9.6 12/23/2020 1010   ALKPHOS 119 12/23/2020 1010   AST 25 12/23/2020 1010   ALT 46 (H) 12/23/2020 1010   BILITOT 0.8 12/23/2020 1010  I have reviewed his labs from 2010. He has had chronic thrombocytopenia.  Lab pending from today  RADIOGRAPHIC STUDIES: I have personally reviewed the radiological images as listed and agreed with the findings in the report. No results found.  All questions were answered. The patient knows to call the clinic with any problems, questions or concerns. I spent 20 minutes in the care of this patient including H and P, review of records, counseling and coordination of care. We discussed about causes of thrombocytopenia, symptoms and signs of thrombocytopenia, surveillance recommendations.    Benay Pike, MD 09/23/2022 9:23 AM

## 2023-09-25 ENCOUNTER — Inpatient Hospital Stay: Payer: 59 | Attending: Hematology and Oncology | Admitting: Hematology and Oncology
